# Patient Record
Sex: Male | Born: 1975 | Race: White | Hispanic: No | Smoking: Current every day smoker
Health system: Southern US, Community
[De-identification: ages and names within clinical notes are randomized; demographics above are authoritative.]

## PROBLEM LIST (undated history)

## (undated) DIAGNOSIS — B182 Chronic viral hepatitis C: Secondary | ICD-10-CM

## (undated) DIAGNOSIS — N289 Disorder of kidney and ureter, unspecified: Secondary | ICD-10-CM

## (undated) HISTORY — PX: HAND SURGERY: SHX662

---

## 1998-06-16 ENCOUNTER — Encounter: Payer: Self-pay | Admitting: Emergency Medicine

## 1998-06-16 ENCOUNTER — Emergency Department (HOSPITAL_COMMUNITY): Admission: EM | Admit: 1998-06-16 | Discharge: 1998-06-16 | Payer: Self-pay | Admitting: Emergency Medicine

## 1998-11-23 ENCOUNTER — Inpatient Hospital Stay (HOSPITAL_COMMUNITY): Admission: EM | Admit: 1998-11-23 | Discharge: 1998-11-27 | Payer: Self-pay | Admitting: Emergency Medicine

## 1999-07-24 ENCOUNTER — Emergency Department (HOSPITAL_COMMUNITY): Admission: EM | Admit: 1999-07-24 | Discharge: 1999-07-25 | Payer: Self-pay | Admitting: Emergency Medicine

## 1999-07-25 ENCOUNTER — Encounter: Payer: Self-pay | Admitting: Emergency Medicine

## 1999-08-03 ENCOUNTER — Emergency Department (HOSPITAL_COMMUNITY): Admission: EM | Admit: 1999-08-03 | Discharge: 1999-08-04 | Payer: Self-pay

## 1999-08-08 ENCOUNTER — Emergency Department (HOSPITAL_COMMUNITY): Admission: EM | Admit: 1999-08-08 | Discharge: 1999-08-08 | Payer: Self-pay | Admitting: Emergency Medicine

## 1999-09-10 ENCOUNTER — Inpatient Hospital Stay (HOSPITAL_COMMUNITY): Admission: EM | Admit: 1999-09-10 | Discharge: 1999-09-11 | Payer: Self-pay

## 1999-09-10 ENCOUNTER — Encounter: Payer: Self-pay | Admitting: *Deleted

## 1999-09-10 ENCOUNTER — Encounter: Payer: Self-pay | Admitting: Surgery

## 1999-09-11 ENCOUNTER — Encounter: Payer: Self-pay | Admitting: General Surgery

## 1999-09-11 ENCOUNTER — Encounter: Payer: Self-pay | Admitting: Surgery

## 1999-10-20 ENCOUNTER — Encounter: Payer: Self-pay | Admitting: Neurosurgery

## 1999-10-20 ENCOUNTER — Ambulatory Visit (HOSPITAL_COMMUNITY): Admission: RE | Admit: 1999-10-20 | Discharge: 1999-10-20 | Payer: Self-pay | Admitting: Neurosurgery

## 2001-04-13 ENCOUNTER — Encounter: Payer: Self-pay | Admitting: Emergency Medicine

## 2001-04-13 ENCOUNTER — Emergency Department (HOSPITAL_COMMUNITY): Admission: EM | Admit: 2001-04-13 | Discharge: 2001-04-13 | Payer: Self-pay | Admitting: Emergency Medicine

## 2002-02-27 ENCOUNTER — Emergency Department (HOSPITAL_COMMUNITY): Admission: EM | Admit: 2002-02-27 | Discharge: 2002-02-27 | Payer: Self-pay | Admitting: Emergency Medicine

## 2002-03-31 ENCOUNTER — Emergency Department (HOSPITAL_COMMUNITY): Admission: EM | Admit: 2002-03-31 | Discharge: 2002-03-31 | Payer: Self-pay | Admitting: Emergency Medicine

## 2002-04-09 ENCOUNTER — Emergency Department (HOSPITAL_COMMUNITY): Admission: EM | Admit: 2002-04-09 | Discharge: 2002-04-09 | Payer: Self-pay | Admitting: Emergency Medicine

## 2002-12-29 ENCOUNTER — Emergency Department (HOSPITAL_COMMUNITY): Admission: EM | Admit: 2002-12-29 | Discharge: 2002-12-29 | Payer: Self-pay | Admitting: Emergency Medicine

## 2003-10-25 ENCOUNTER — Emergency Department (HOSPITAL_COMMUNITY): Admission: EM | Admit: 2003-10-25 | Discharge: 2003-10-25 | Payer: Self-pay | Admitting: *Deleted

## 2004-04-08 ENCOUNTER — Emergency Department (HOSPITAL_COMMUNITY): Admission: EM | Admit: 2004-04-08 | Discharge: 2004-04-08 | Payer: Self-pay | Admitting: Emergency Medicine

## 2004-04-14 ENCOUNTER — Emergency Department (HOSPITAL_COMMUNITY): Admission: EM | Admit: 2004-04-14 | Discharge: 2004-04-15 | Payer: Self-pay | Admitting: *Deleted

## 2004-05-06 ENCOUNTER — Inpatient Hospital Stay: Payer: Self-pay | Admitting: Unknown Physician Specialty

## 2004-08-27 ENCOUNTER — Inpatient Hospital Stay: Payer: Self-pay | Admitting: Unknown Physician Specialty

## 2004-09-16 ENCOUNTER — Emergency Department (HOSPITAL_COMMUNITY): Admission: EM | Admit: 2004-09-16 | Discharge: 2004-09-16 | Payer: Self-pay | Admitting: Emergency Medicine

## 2004-11-02 ENCOUNTER — Emergency Department (HOSPITAL_COMMUNITY): Admission: EM | Admit: 2004-11-02 | Discharge: 2004-11-02 | Payer: Self-pay | Admitting: Emergency Medicine

## 2004-11-11 ENCOUNTER — Emergency Department (HOSPITAL_COMMUNITY): Admission: EM | Admit: 2004-11-11 | Discharge: 2004-11-12 | Payer: Self-pay | Admitting: Emergency Medicine

## 2005-01-31 ENCOUNTER — Emergency Department (HOSPITAL_COMMUNITY): Admission: EM | Admit: 2005-01-31 | Discharge: 2005-01-31 | Payer: Self-pay | Admitting: Emergency Medicine

## 2005-05-29 ENCOUNTER — Ambulatory Visit (HOSPITAL_COMMUNITY): Admission: RE | Admit: 2005-05-29 | Discharge: 2005-05-29 | Payer: Self-pay | Admitting: Family Medicine

## 2005-05-30 ENCOUNTER — Ambulatory Visit (HOSPITAL_COMMUNITY): Admission: RE | Admit: 2005-05-30 | Discharge: 2005-05-30 | Payer: Self-pay | Admitting: Family Medicine

## 2005-06-01 ENCOUNTER — Emergency Department (HOSPITAL_COMMUNITY): Admission: EM | Admit: 2005-06-01 | Discharge: 2005-06-01 | Payer: Self-pay | Admitting: Emergency Medicine

## 2005-06-10 ENCOUNTER — Ambulatory Visit (HOSPITAL_COMMUNITY): Admission: RE | Admit: 2005-06-10 | Discharge: 2005-06-10 | Payer: Self-pay | Admitting: Internal Medicine

## 2006-01-21 ENCOUNTER — Emergency Department (HOSPITAL_COMMUNITY): Admission: EM | Admit: 2006-01-21 | Discharge: 2006-01-21 | Payer: Self-pay | Admitting: Emergency Medicine

## 2006-02-06 ENCOUNTER — Emergency Department (HOSPITAL_COMMUNITY): Admission: EM | Admit: 2006-02-06 | Discharge: 2006-02-06 | Payer: Self-pay | Admitting: Emergency Medicine

## 2006-05-24 ENCOUNTER — Emergency Department (HOSPITAL_COMMUNITY): Admission: EM | Admit: 2006-05-24 | Discharge: 2006-05-24 | Payer: Self-pay | Admitting: Emergency Medicine

## 2006-06-02 ENCOUNTER — Emergency Department (HOSPITAL_COMMUNITY): Admission: EM | Admit: 2006-06-02 | Discharge: 2006-06-02 | Payer: Self-pay | Admitting: Emergency Medicine

## 2006-07-05 ENCOUNTER — Emergency Department (HOSPITAL_COMMUNITY): Admission: EM | Admit: 2006-07-05 | Discharge: 2006-07-05 | Payer: Self-pay | Admitting: Emergency Medicine

## 2006-07-19 ENCOUNTER — Inpatient Hospital Stay: Payer: Self-pay | Admitting: Unknown Physician Specialty

## 2006-09-24 ENCOUNTER — Emergency Department (HOSPITAL_COMMUNITY): Admission: EM | Admit: 2006-09-24 | Discharge: 2006-09-24 | Payer: Self-pay | Admitting: Emergency Medicine

## 2006-11-12 ENCOUNTER — Emergency Department (HOSPITAL_COMMUNITY): Admission: EM | Admit: 2006-11-12 | Discharge: 2006-11-12 | Payer: Self-pay | Admitting: Emergency Medicine

## 2006-12-02 ENCOUNTER — Emergency Department (HOSPITAL_COMMUNITY): Admission: EM | Admit: 2006-12-02 | Discharge: 2006-12-03 | Payer: Self-pay | Admitting: Emergency Medicine

## 2006-12-03 ENCOUNTER — Emergency Department (HOSPITAL_COMMUNITY): Admission: EM | Admit: 2006-12-03 | Discharge: 2006-12-03 | Payer: Self-pay | Admitting: Emergency Medicine

## 2006-12-08 ENCOUNTER — Emergency Department (HOSPITAL_COMMUNITY): Admission: EM | Admit: 2006-12-08 | Discharge: 2006-12-08 | Payer: Self-pay | Admitting: Emergency Medicine

## 2007-05-29 ENCOUNTER — Emergency Department: Payer: Self-pay | Admitting: Emergency Medicine

## 2007-07-10 ENCOUNTER — Emergency Department (HOSPITAL_COMMUNITY): Admission: EM | Admit: 2007-07-10 | Discharge: 2007-07-10 | Payer: Self-pay | Admitting: Emergency Medicine

## 2007-09-16 ENCOUNTER — Emergency Department (HOSPITAL_COMMUNITY): Admission: EM | Admit: 2007-09-16 | Discharge: 2007-09-16 | Payer: Self-pay | Admitting: Emergency Medicine

## 2007-11-14 ENCOUNTER — Emergency Department (HOSPITAL_COMMUNITY): Admission: EM | Admit: 2007-11-14 | Discharge: 2007-11-15 | Payer: Self-pay | Admitting: Emergency Medicine

## 2007-11-22 ENCOUNTER — Encounter: Payer: Self-pay | Admitting: Emergency Medicine

## 2007-11-23 ENCOUNTER — Inpatient Hospital Stay (HOSPITAL_COMMUNITY): Admission: AD | Admit: 2007-11-23 | Discharge: 2007-11-26 | Payer: Self-pay | Admitting: Psychiatry

## 2007-11-23 ENCOUNTER — Ambulatory Visit: Payer: Self-pay | Admitting: Psychiatry

## 2008-02-28 ENCOUNTER — Emergency Department (HOSPITAL_COMMUNITY): Admission: EM | Admit: 2008-02-28 | Discharge: 2008-02-28 | Payer: Self-pay | Admitting: Emergency Medicine

## 2008-06-18 ENCOUNTER — Emergency Department (HOSPITAL_COMMUNITY): Admission: EM | Admit: 2008-06-18 | Discharge: 2008-06-18 | Payer: Self-pay | Admitting: Emergency Medicine

## 2009-01-24 ENCOUNTER — Emergency Department (HOSPITAL_COMMUNITY): Admission: EM | Admit: 2009-01-24 | Discharge: 2009-01-25 | Payer: Self-pay | Admitting: Emergency Medicine

## 2010-05-08 ENCOUNTER — Emergency Department (HOSPITAL_COMMUNITY): Admission: EM | Admit: 2010-05-08 | Discharge: 2010-05-08 | Payer: Self-pay | Admitting: Emergency Medicine

## 2010-08-27 LAB — CULTURE, ROUTINE-ABSCESS

## 2010-10-29 NOTE — H&P (Signed)
[name] [name], [name] [name]                ACCOUNT NO.:  [id number]   MEDICAL RECORD NO.:  [id number]          PATIENT TYPE:  IPS   LOCATION:  0306                          FACILITY:  [facility]   PHYSICIAN:  Jasmine Pang, M.D. DATE OF BIRTH:  Jun 18, 1975   DATE OF ADMISSION:  11/23/2007  DATE OF DISCHARGE:                       PSYCHIATRIC ADMISSION ASSESSMENT   HISTORY OF PRESENT ILLNESS:  The patient presents with a history of  substance use.  He has been using drugs for approximately 14 years,  primarily using OxyContin and oxycodone, using up to anywhere from 150-  400 mg of opiates daily.  He states he has been unable to find pills and  needs to be detoxed.  He has been using heroin as well, using IV.  He  was in the methadone program prior, which was helpful, but states  financially he was unable to participate any more.  Denies any  depression, suicidal thoughts, or hallucinations.   PAST PSYCHIATRIC HISTORY:  First admission to [facility].  Again, was at the [facility] before for methadone program, but was  unable to afford medications and had no transportation.   SOCIAL HISTORY:  He is a 35 year old male who lives in [place].  He  has been doing odd jobs.  He lives with his mother.   FAMILY HISTORY:  None.   ALCOHOL AND DRUG HISTORY:  As above.  No alcohol use.   PRIMARY CARE Yesmin Mutch:  Has none currently.   MEDICAL PROBLEMS:  Denies any known medical issues.   MEDICATIONS:  None prior to arrival.   DRUG ALLERGIES:  No known allergies.   PHYSICAL EXAMINATION:  GENERAL:  The patient was fully assessed at [facility] Emergency Department.  He received Ativan.  He presents today in no  acute physical distress.  He is feeling anxious and requesting Xanax.  VITAL SIGNS:  Temperature is 98.6, 78 heart rate, 18 respirations, blood  pressure is 117/73.  He is 147 pounds, 6 feet 1 inch tall.   Urine drug screen is positive for opiates, positive for cocaine.  Urinalysis is negative.  Chest x-ray was negative.   MENTAL STATUS EXAM:  He is alert, cooperative, poor eye contact, dressed  appropriately.  Speech is soft-spoken.  The patient's affect is flat.  Thought processes are coherent.  No evidence of psychosis.  Cognitive  function is intact.  Memory is good.  Judgment and insight appear to be  good.  He is requesting help.   DIAGNOSES:   AXIS I:  Opiate dependence.  Cocaine abuse.   AXIS II:  Deferred.   AXIS III:  No known medical conditions.   AXIS IV:  Other psychosocial problems related to chronic substance use,  and possible problems related to occupation and economic issues.   AXIS V:  Current is 40.   PLAN:  Contract for safety.  Stabilize mood and thinking.  We will  initiate the clonidine protocol, although the patient is endorsing that  he is unable to tolerate the clonidine.  We will continue to offer  medications and offer the p.r.n.'s available for stomach  cramps and  pain.  We will also have Librium available for anxiety.  We will also  add Seroquel, as the patient is expressing some sensations of feeling  anxious.  Case manager will assess follow-up rehab with him.  His  tentative length of stay is 3-5 days.      Landry Corporal, N.P.      Jasmine Pang, M.D.  Electronically Signed    JO/MEDQ  D:  11/24/2007  T:  11/24/2007  Job:  [id number]

## 2010-10-29 NOTE — Discharge Summary (Signed)
[name], [name]                ACCOUNT NO.:  [id number]   MEDICAL RECORD NO.:  [id number]          PATIENT TYPE:  IPS   LOCATION:  0306                          FACILITY:  [facility]   PHYSICIAN:  Jasmine Pang, M.D. DATE OF BIRTH:  [birth year]   DATE OF ADMISSION:  11/23/2007  DATE OF DISCHARGE:  11/26/2007                               DISCHARGE SUMMARY   IDENTIFICATION:  This is a 35 year old single white male who lives in  [place].  He was admitted on a voluntary basis on November 23, 2007.   HISTORY OF PRESENT ILLNESS:  The patient presents with a history of  substance use.  He has been using drugs for approximately 14 years,  primarily OxyContin and oxycodone.  He states he uses up to anywhere  from 150-400 mg of opiates daily.  He states he had been unable to find  pills and needed to be detox.  He has been using heroin as well IV.  He  was in methadone program prior, which was helpful, but states  financially, he was unable to participate anymore.  He denies any  depression, suicidal thoughts, or hallucinations.   PAST PSYCHIATRIC HISTORY:  This is the first admission to [facility], again he was at [facility] for the methadone  program, but was unable to afford the medications and had no  transportation.   FAMILY HISTORY:  None.   ALCOHOL AND DRUG HISTORY:  As above.  No alcohol abuse.   PRIMARY CARE:  None currently.   MEDICAL PROBLEMS:  No known medical issues.   MEDICATIONS:  None prior to arrival.   ALLERGIES:  No known drug allergies.   PHYSICAL FINDINGS:  There were no acute physical or medical problems  noted.  He was fully assessed at the [facility] Emergency Department.   ADMISSION LABORATORIES:  Urine drug screen was positive for opiates and  positive for cocaine.  Urinalysis was negative.  Chest x-ray was  negative.   HOSPITAL COURSE:  Upon admission, the patient was started on trazodone  100 mg p.o. q.h.s. and clonidine detox  protocol.  He was started on  Seroquel 50 mg p.o. q.6 hours p.r.n. anxiety.  The patient tolerated  these medications well with no significant side effects.  He did not  stay on the clonidine.  He did not feel he needed this and was not  taking it.  In individual sessions with me, the patient was reserved and  somewhat uncooperative.  He stated he was on methadone, but was unable  to afford this.  He used cocaine and heroin.  He does not like the  clonidine, that makes him feel bad.  He states he wants long-term rehab.  As hospitalization progressed, mental status improved.  The patient was  somewhat irritable.  On November 25, 2007, he wanted to go home if he could  not get methadone or Suboxone.  He then decided he wanted to go to the  AD[facility] program at [facility] and requested discharge and he wants to do this.  On November 26, 2007, his sleep was  good.  Mood was less depressed, less  anxious.  Affect, consistent with mood.  There was no suicidal or  homicidal ideation.  No thoughts of self-injurious behavior.  No  auditory or visual hallucinations.  No paranoia or delusions.  Thoughts  were logical and goal-directed.  Thought content, no predominant theme.  Cognitive was grossly intact.  The patient wanted to leave today to go  to ADAT program at [place].   DISCHARGE DIAGNOSES:  Axis I:  Opiate dependence and cocaine abuse.  Axis II:  Features of personality disorder, not otherwise specified.  Axis III:  None.  Axis IV: Other psychosocial problems related to chronic substance use  and possible problems related to occupation and economic issues -  moderate.  Axis V:  Global assessment of functioning was 50 upon discharge and 40  upon admission.  GAF highest past year was 65.   DISCHARGE PLANS:  There was no specific activity level or dietary  restrictions.   POSTHOSPITAL CARE PLANS:  The patient is going to go to the [facility]  program on November 26, 2007.  He is going to walk-in.  He states they  have  beds available for a walk-in patient.   DISCHARGE MEDICATIONS:  None.      Jasmine Pang, M.D.  Electronically Signed     BHS/MEDQ  D:  11/26/2007  T:  11/26/2007  Job:  [id number]
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
## 2011-02-12 ENCOUNTER — Emergency Department (HOSPITAL_COMMUNITY)
Admission: EM | Admit: 2011-02-12 | Discharge: 2011-02-12 | Disposition: A | Discharge status: Home / Self Care | Payer: Self-pay | Attending: Emergency Medicine | Admitting: Emergency Medicine

## 2011-02-12 DIAGNOSIS — F17200 Nicotine dependence, unspecified, uncomplicated: Secondary | ICD-10-CM | POA: Insufficient documentation

## 2011-02-12 DIAGNOSIS — S335XXA Sprain of ligaments of lumbar spine, initial encounter: Secondary | ICD-10-CM | POA: Insufficient documentation

## 2011-02-12 DIAGNOSIS — X500XXA Overexertion from strenuous movement or load, initial encounter: Secondary | ICD-10-CM | POA: Insufficient documentation

## 2011-02-12 DIAGNOSIS — Y9269 Other specified industrial and construction area as the place of occurrence of the external cause: Secondary | ICD-10-CM | POA: Insufficient documentation

## 2011-02-12 DIAGNOSIS — S39012A Strain of muscle, fascia and tendon of lower back, initial encounter: Secondary | ICD-10-CM

## 2011-02-12 MED ORDER — IBUPROFEN 800 MG PO TABS: 800.0000 mg | ORAL_TABLET | Freq: Three times a day (TID) | ORAL | Status: AC

## 2011-02-12 MED ORDER — KETOROLAC TROMETHAMINE 60 MG/2ML IM SOLN
60.0000 mg | Freq: Once | INTRAMUSCULAR | Status: AC
  Administered 2011-02-12: 60 mg via INTRAMUSCULAR
  Filled 2011-02-12: qty 2

## 2011-02-12 MED ORDER — CYCLOBENZAPRINE HCL 10 MG PO TABS: 10.0000 mg | ORAL_TABLET | Freq: Two times a day (BID) | ORAL | Status: AC | PRN

## 2011-02-12 NOTE — ED Notes (Signed)
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  Today picked up piece of plywood and had severe pain in lower back.  Pain doesn't radiate down legs.

## 2011-02-12 NOTE — ED Provider Notes (Signed)
History   Scribed for Vida Roller, MD, the patient was seen in room APA09/APA09. This chart was scribed by Clarita Crane. This patient's care was started at 10:24AM.   CSN: [id number] Arrival date & time: 02/12/2011 10:03 AM  Chief Complaint  Patient presents with  . Back Pain   HPI [name] [name] is a 35 y.o. male who presents to the Emergency Department complaining of constant non-radiating lower back pain onset yesterday but worse this morning afterpersistent since. 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 Reports back pain is aggravated with flexion of torso at waist. Denies numbness, weakness, dysuria, urinary retention, incontinence.   History reviewed. No pertinent past medical history.  Past Surgical History  Procedure Date  . Hand surgery     No family history on file.  History  Substance Use Topics  . Smoking status: Current Everyday Smoker  . Smokeless tobacco: Not on file  . Alcohol Use: No     Review of Systems  Constitutional: Negative for fever.  HENT: Negative for neck pain.   Respiratory: Negative for shortness of breath.   Cardiovascular: Negative for chest pain.  Gastrointestinal: Negative for nausea.  Genitourinary: Negative for dysuria and hematuria.  Musculoskeletal: Positive for back pain.  Neurological: Negative for weakness and numbness.    Physical Exam  BP 109/82  Pulse 73  Temp(Src) 97.3 F (36.3 C) (Oral)  Resp 18  Ht 5\' 11"  (1.803 m)  Wt 165 lb (74.844 kg)  BMI 23.01 kg/m2  SpO2 100%  Physical Exam  Nursing note and vitals reviewed. Constitutional: He is oriented to person, place, and time. He appears well-developed and well-nourished. No distress.       Uncomfortable appearing.   HENT:  Head: Normocephalic and atraumatic.  Eyes: EOM are normal.  Neck: Neck supple.  Cardiovascular: Normal rate, regular rhythm and normal heart sounds.  Exam reveals no gallop and no  friction rub.   No murmur heard. Pulmonary/Chest: Effort normal and breath sounds normal. He has no wheezes.  Musculoskeletal: Normal range of motion. He exhibits no edema.       No midline spinal tenderness. Mild right lower paraspinal tenderness.   Neurological: He is alert and oriented to person, place, and time. He has normal strength and normal reflexes. No sensory deficit.       Gait normal.   Skin: Skin is warm and dry.  Psychiatric: He has a normal mood and affect. His behavior is normal.    ED Course  Procedures  MDM Overall he is well appearing with focal low back tenderness after trying to lift a heavy object. Has normal neurologic exam, gait, reflexes, sensation, strength. No pathologic findings found. Intramuscular Toradol given. Discharge home with followup as needed.  I personally performed the services described in this documentation, which was scribed in my presence. The recorded information has been reviewed and considered. Vida Roller, MD       Vida Roller, MD 02/12/11 [phone]

## 2011-02-13 ENCOUNTER — Emergency Department (HOSPITAL_COMMUNITY)
Admission: EM | Admit: 2011-02-13 | Discharge: 2011-02-14 | Disposition: A | Discharge status: Home / Self Care | Payer: Self-pay | Attending: Emergency Medicine | Admitting: Emergency Medicine

## 2011-02-13 ENCOUNTER — Encounter (HOSPITAL_COMMUNITY): Payer: Self-pay | Admitting: Emergency Medicine

## 2011-02-13 DIAGNOSIS — Y9269 Other specified industrial and construction area as the place of occurrence of the external cause: Secondary | ICD-10-CM | POA: Insufficient documentation

## 2011-02-13 DIAGNOSIS — X500XXA Overexertion from strenuous movement or load, initial encounter: Secondary | ICD-10-CM | POA: Insufficient documentation

## 2011-02-13 DIAGNOSIS — M545 Low back pain, unspecified: Secondary | ICD-10-CM | POA: Insufficient documentation

## 2011-02-13 DIAGNOSIS — M5416 Radiculopathy, lumbar region: Secondary | ICD-10-CM

## 2011-02-13 DIAGNOSIS — F17200 Nicotine dependence, unspecified, uncomplicated: Secondary | ICD-10-CM | POA: Insufficient documentation

## 2011-02-13 DIAGNOSIS — IMO0002 Reserved for concepts with insufficient information to code with codable children: Secondary | ICD-10-CM | POA: Insufficient documentation

## 2011-02-13 NOTE — ED Notes (Signed)
C/o low back pain with incontinence of stool

## 2011-02-13 NOTE — ED Notes (Addendum)
Pt seen in ER earlier today, reports increase in back pain, w/ numbness & weakness to the left leg. States medications not working & does not feel like a muscle to him thinks he has messed up something in his back

## 2011-02-14 MED ORDER — HYDROCODONE-ACETAMINOPHEN 5-325 MG PO TABS: ORAL_TABLET | ORAL | Status: DC

## 2011-02-14 MED ORDER — PREDNISONE 10 MG PO TABS: 50.0000 mg | ORAL_TABLET | Freq: Every day | ORAL | Status: AC

## 2011-02-14 MED ORDER — PREDNISONE 20 MG PO TABS
60.0000 mg | ORAL_TABLET | Freq: Once | ORAL | Status: AC
  Administered 2011-02-14: 60 mg via ORAL
  Filled 2011-02-14: qty 3

## 2011-02-14 MED ORDER — HYDROCODONE-ACETAMINOPHEN 5-325 MG PO TABS
1.0000 | ORAL_TABLET | Freq: Once | ORAL | Status: AC
  Administered 2011-02-14: 1 via ORAL
  Filled 2011-02-14: qty 1

## 2011-02-14 NOTE — ED Provider Notes (Signed)
History     CSN: [id number] Arrival date & time: 02/13/2011  9:57 PM  Chief Complaint  Patient presents with  . Back Pain   HPI Comments: Seen here yest for same problem.  Injured back 2 days ago lifting sheet of plywood.  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  Back pain is worse and radiates to L foot.  Patient is a 35 y.o. male presenting with back pain. The history is provided by the patient. No language interpreter was used.  Back Pain  This is a new problem. The current episode started 2 days ago. The problem occurs constantly. The problem has been gradually worsening. The pain is associated with lifting heavy objects. The pain is present in the lumbar spine. The quality of the pain is described as stabbing. The pain radiates to the left foot. The pain is at a severity of 7/10.    History reviewed. No pertinent past medical history.  Past Surgical History  Procedure Date  . Hand surgery     No family history on file.  History  Substance Use Topics  . Smoking status: Current Everyday Smoker  . Smokeless tobacco: Not on file  . Alcohol Use: No      Review of Systems  Musculoskeletal: Positive for back pain.  All other systems reviewed and are negative.    Physical Exam  BP 106/63  Pulse 70  Temp 97.8 F (36.6 C)  Resp 16  Ht 5\' 11"  (1.803 m)  Wt 160 lb (72.576 kg)  BMI 22.32 kg/m2  SpO2 100%  Physical Exam  Nursing note and vitals reviewed. Constitutional: He is oriented to person, place, and time. Vital signs are normal. He appears well-developed and well-nourished. No distress.  HENT:  Head: Normocephalic and atraumatic.  Right Ear: External ear normal.  Left Ear: External ear normal.  Nose: Nose normal.  Mouth/Throat: No oropharyngeal exudate.  Eyes: Conjunctivae and EOM are normal. Pupils are equal, round, and reactive to light. Right eye exhibits no discharge. Left eye exhibits no discharge. No scleral icterus.   Neck: Normal range of motion. Neck supple. No JVD present. No tracheal deviation present. No thyromegaly present.  Cardiovascular: Normal rate, regular rhythm, normal heart sounds, intact distal pulses and normal pulses.  Exam reveals no gallop and no friction rub.   No murmur heard. Pulmonary/Chest: Effort normal and breath sounds normal. No stridor. No respiratory distress. He has no wheezes. He has no rales. He exhibits no tenderness.  Abdominal: Soft. Normal appearance and bowel sounds are normal. He exhibits no distension and no mass. There is no tenderness. There is no rebound and no guarding.  Musculoskeletal: Normal range of motion. He exhibits no edema and no tenderness.       Arms: Lymphadenopathy:    He has no cervical adenopathy.  Neurological: He is alert and oriented to person, place, and time. He has normal reflexes. He displays normal reflexes. No cranial nerve deficit or sensory deficit. He exhibits normal muscle tone. Coordination normal. GCS eye subscore is 4. GCS verbal subscore is 5. GCS motor subscore is 6.  Reflex Scores:      Patellar reflexes are 2+ on the right side and 2+ on the left side.      Achilles reflexes are 2+ on the right side and 2+ on the left side. Skin: Skin is warm and dry. No rash noted. He is not diaphoretic.  Psychiatric: He has a normal mood  and affect. His speech is normal and behavior is normal. Judgment and thought content normal. Cognition and memory are normal.    ED Course  Procedures  MDM       Worthy Rancher, PA 02/14/11 [phone]

## 2011-03-03 NOTE — ED Provider Notes (Signed)
Medical screening examination/treatment/procedure(s) were performed by non-physician practitioner and as supervising physician I was immediately available for consultation/collaboration.  Nicoletta Dress. Colon Branch, MD 03/03/11 [phone]

## 2011-03-07 ENCOUNTER — Emergency Department (HOSPITAL_COMMUNITY)
Admission: EM | Admit: 2011-03-07 | Discharge: 2011-03-07 | Discharge status: Against Medical Advice | Payer: Self-pay | Attending: Emergency Medicine | Admitting: Emergency Medicine

## 2011-03-07 ENCOUNTER — Encounter (HOSPITAL_COMMUNITY): Payer: Self-pay | Admitting: *Deleted

## 2011-03-07 DIAGNOSIS — Z5320 Procedure and treatment not carried out because of patient's decision for unspecified reasons: Secondary | ICD-10-CM | POA: Insufficient documentation

## 2011-03-07 DIAGNOSIS — R109 Unspecified abdominal pain: Secondary | ICD-10-CM | POA: Insufficient documentation

## 2011-03-07 NOTE — ED Notes (Signed)
Pt c/o mid abd pain that started today; pt denies any vomiting but has been nauseous

## 2011-03-08 ENCOUNTER — Encounter (HOSPITAL_COMMUNITY): Payer: Self-pay | Admitting: *Deleted

## 2011-03-08 ENCOUNTER — Emergency Department (HOSPITAL_COMMUNITY): Payer: Medicaid Other

## 2011-03-08 ENCOUNTER — Emergency Department (HOSPITAL_COMMUNITY)
Admission: EM | Admit: 2011-03-08 | Discharge: 2011-03-08 | Disposition: A | Discharge status: Home / Self Care | Payer: Medicaid Other | Attending: Emergency Medicine | Admitting: Emergency Medicine

## 2011-03-08 DIAGNOSIS — R1010 Upper abdominal pain, unspecified: Secondary | ICD-10-CM

## 2011-03-08 DIAGNOSIS — F17200 Nicotine dependence, unspecified, uncomplicated: Secondary | ICD-10-CM | POA: Insufficient documentation

## 2011-03-08 DIAGNOSIS — R109 Unspecified abdominal pain: Secondary | ICD-10-CM | POA: Insufficient documentation

## 2011-03-08 LAB — URINALYSIS, ROUTINE W REFLEX MICROSCOPIC
Bilirubin Urine: NEGATIVE
Glucose, UA: NEGATIVE mg/dL
Hgb urine dipstick: NEGATIVE
Ketones, ur: NEGATIVE mg/dL
Leukocytes, UA: NEGATIVE
Nitrite: NEGATIVE
Protein, ur: NEGATIVE mg/dL
Specific Gravity, Urine: 1.02 (ref 1.005–1.030)
Urobilinogen, UA: 0.2 mg/dL (ref 0.0–1.0)
pH: 6.5 (ref 5.0–8.0)

## 2011-03-08 LAB — COMPREHENSIVE METABOLIC PANEL
ALT: 14 U/L (ref 0–53)
AST: 22 U/L (ref 0–37)
Albumin: 3.6 g/dL (ref 3.5–5.2)
Alkaline Phosphatase: 86 U/L (ref 39–117)
BUN: 12 mg/dL (ref 6–23)
CO2: 28 mEq/L (ref 19–32)
Calcium: 9.6 mg/dL (ref 8.4–10.5)
Chloride: 101 mEq/L (ref 96–112)
Creatinine, Ser: 0.96 mg/dL (ref 0.50–1.35)
GFR calc Af Amer: >60 mL/min (ref >60)
GFR calc non Af Amer: >60 mL/min (ref >60)
Glucose, Bld: 105 mg/dL — ABNORMAL HIGH (ref 70–99)
Potassium: 4 mEq/L (ref 3.5–5.1)
Sodium: 137 mEq/L (ref 135–145)
Total Bilirubin: 0.4 mg/dL (ref 0.3–1.2)
Total Protein: 7.8 g/dL (ref 6.0–8.3)

## 2011-03-08 LAB — DIFFERENTIAL
Basophils Absolute: 0 10*3/uL (ref 0.0–0.1)
Basophils Relative: 0 % (ref 0–1)
Eosinophils Absolute: 0.2 10*3/uL (ref 0.0–0.7)
Eosinophils Relative: 3 % (ref 0–5)
Lymphocytes Relative: 25 % (ref 12–46)
Lymphs Abs: 1.7 10*3/uL (ref 0.7–4.0)
Monocytes Absolute: 0.5 10*3/uL (ref 0.1–1.0)
Monocytes Relative: 7 % (ref 3–12)
Neutro Abs: 4.6 10*3/uL (ref 1.7–7.7)
Neutrophils Relative %: 65 % (ref 43–77)

## 2011-03-08 LAB — CBC
HCT: 43 % (ref 39.0–52.0)
Hemoglobin: 14.9 g/dL (ref 13.0–17.0)
MCH: 31.6 pg (ref 26.0–34.0)
MCHC: 34.7 g/dL (ref 30.0–36.0)
MCV: 91.3 fL (ref 78.0–100.0)
Platelets: 232 10*3/uL (ref 150–400)
RBC: 4.71 MIL/uL (ref 4.22–5.81)
RDW: 13 % (ref 11.5–15.5)
WBC: 7 10*3/uL (ref 4.0–10.5)

## 2011-03-08 LAB — LIPASE, BLOOD: Lipase: 25 U/L (ref 11–59)

## 2011-03-08 MED ORDER — GI COCKTAIL ~~LOC~~
30.0000 mL | Freq: Once | ORAL | Status: AC
  Administered 2011-03-08: 30 mL via ORAL
  Filled 2011-03-08: qty 30

## 2011-03-08 MED ORDER — SODIUM CHLORIDE 0.9 % IV SOLN
Freq: Once | INTRAVENOUS | Status: AC
  Administered 2011-03-08: 13:00:00 via INTRAVENOUS

## 2011-03-08 MED ORDER — HYDROMORPHONE HCL 1 MG/ML IJ SOLN
1.0000 mg | Freq: Once | INTRAMUSCULAR | Status: AC
  Administered 2011-03-08: 1 mg via INTRAVENOUS
  Filled 2011-03-08: qty 1

## 2011-03-08 MED ORDER — HYDROCODONE-ACETAMINOPHEN 5-325 MG PO TABS: ORAL_TABLET | ORAL | Status: AC

## 2011-03-08 MED ORDER — FAMOTIDINE IN NACL 20-0.9 MG/50ML-% IV SOLN
20.0000 mg | Freq: Once | INTRAVENOUS | Status: AC
  Administered 2011-03-08: 20 mg via INTRAVENOUS
  Filled 2011-03-08: qty 50

## 2011-03-08 NOTE — ED Notes (Signed)
Pt states that the pain medication has helped "somewhat" pt and family updated on plan of care

## 2011-03-08 NOTE — ED Notes (Signed)
Dr. Clarene Duke notified of pt's pain being unchanged, no additional orders given

## 2011-03-08 NOTE — ED Provider Notes (Signed)
History  Scribed for Dr. Clarene Duke, the patient was seen in room 17. The chart was scribed by Gilman Schmidt. The patients care was started at 1233.  CSN: [id number] Arrival date & time: 03/08/2011 11:15 AM  Chief Complaint  Patient presents with  . Abdominal Pain    HPI   HPI Pt was seen at 1233.  [name] [name] is a 35 y.o. male who presents to the Emergency Department complaining of gradual onset and persistence of constant upper abdominal "pain" since last night. Pt describes the pain as a "dull ache," and that it "feels like someone punched me in the stomach."  Pt came to the ED last night but left AMA because he didn't want to wait.  Has not taken any OTC's for same.  Has previous hx of same pain but cannot recall what the dx was at that time.  Denies sick contacts.  Denies CP/SOB, no N/V/D, no back pain, no fevers, no rash.   PAST MEDICAL HISTORY:  History reviewed. No pertinent past medical history.   PAST SURGICAL HISTORY:  Past Surgical History  Procedure Date  . Hand surgery      MEDICATIONS:  Previous Medications   HYDROCODONE-ACETAMINOPHEN (NORCO) 5-325 MG PER TABLET    One po QID prn pain     ALLERGIES:  Allergies as of 03/08/2011  . (No Known Allergies)     SOCIAL HISTORY: History  Substance Use Topics  . Smoking status: Current Everyday Smoker  . Smokeless tobacco: Not on file  . Alcohol Use: No      Review of Systems  Review of Systems ROS: Statement: All systems negative except as marked or noted in the HPI; Constitutional: Negative for fever and chills. ; ; Eyes: Negative for eye pain, redness and discharge. ; ; ENMT: Negative for ear pain, hoarseness, nasal congestion, sinus pressure and sore throat. ; ; Cardiovascular: Negative for chest pain, palpitations, diaphoresis, dyspnea and peripheral edema. ; ; Respiratory: Negative for cough, wheezing and stridor. ; ; Gastrointestinal: +upper abd pain. Negative for nausea, vomiting, diarrhea, blood in stool,  hematemesis, jaundice and rectal bleeding. . ; ; Genitourinary: Negative for dysuria, flank pain and hematuria. ; ; Musculoskeletal: Negative for back pain and neck pain. Negative for swelling and trauma.; ; Skin: Negative for pruritus, rash, abrasions, blisters, bruising and skin lesion.; ; Neuro: Negative for headache, lightheadedness and neck stiffness. Negative for weakness, altered level of consciousness , altered mental status, extremity weakness, paresthesias, involuntary movement, seizure and syncope.     Physical Exam    BP 116/73  Pulse 59  Temp(Src) 98 F (36.7 C) (Oral)  Resp 20  Ht 5\' 11"  (1.803 m)  Wt 160 lb (72.576 kg)  BMI 22.32 kg/m2  SpO2 99%  Physical Exam 1233: Physical examination:  Nursing notes reviewed; Vital signs and O2 SAT reviewed;  Constitutional: Well developed, Well nourished, Well hydrated, In no acute distress; Head:  Normocephalic, atraumatic; Eyes: EOMI, PERRL, No scleral icterus; ENMT: Mouth and pharynx normal, Mucous membranes moist; Neck: Supple, Full range of motion, No lymphadenopathy; Cardiovascular: Regular rate and rhythm, No murmur, rub, or gallop; Respiratory: Breath sounds clear & equal bilaterally, No rales, rhonchi, wheezes, or rub, Normal respiratory effort/excursion; Chest: Nontender, Movement normal; Abdomen: Soft, +tenderness mid-epigastric area to palp.  No rebound or guarding., Nondistended, Normal bowel sounds;  Extremities: Pulses normal, No tenderness, No edema, No calf edema or asymmetry.; Neuro: AA&Ox3, Major CN grossly intact.  Speech clear. No gross focal motor or sensory  deficits in extremities.; Skin: Color normal, Warm, Dry, no rash.    ED Course  Procedures  MDM Reviewed: nursing note and vitals Interpretation: labs and x-ray      Labs:  Results for orders placed during the hospital encounter of 03/08/11  URINALYSIS, ROUTINE W REFLEX MICROSCOPIC      Component Value Range   Color, Urine YELLOW  YELLOW    Appearance  CLEAR  CLEAR    Specific Gravity, Urine 1.020  1.005 - 1.030    pH 6.5  5.0 - 8.0    Glucose, UA NEGATIVE  NEGATIVE (mg/dL)   Hgb urine dipstick NEGATIVE  NEGATIVE    Bilirubin Urine NEGATIVE  NEGATIVE    Ketones, ur NEGATIVE  NEGATIVE (mg/dL)   Protein, ur NEGATIVE  NEGATIVE (mg/dL)   Urobilinogen, UA 0.2  0.0 - 1.0 (mg/dL)   Nitrite NEGATIVE  NEGATIVE    Leukocytes, UA NEGATIVE  NEGATIVE   CBC      Component Value Range   WBC 7.0  4.0 - 10.5 (K/uL)   RBC 4.71  4.22 - 5.81 (MIL/uL)   Hemoglobin 14.9  13.0 - 17.0 (g/dL)   HCT 16.1  09.6 - 04.5 (%)   MCV 91.3  78.0 - 100.0 (fL)   MCH 31.6  26.0 - 34.0 (pg)   MCHC 34.7  30.0 - 36.0 (g/dL)   RDW 40.9  81.1 - 91.4 (%)   Platelets 232  150 - 400 (K/uL)  DIFFERENTIAL      Component Value Range   Neutrophils Relative 65  43 - 77 (%)   Neutro Abs 4.6  1.7 - 7.7 (K/uL)   Lymphocytes Relative 25  12 - 46 (%)   Lymphs Abs 1.7  0.7 - 4.0 (K/uL)   Monocytes Relative 7  3 - 12 (%)   Monocytes Absolute 0.5  0.1 - 1.0 (K/uL)   Eosinophils Relative 3  0 - 5 (%)   Eosinophils Absolute 0.2  0.0 - 0.7 (K/uL)   Basophils Relative 0  0 - 1 (%)   Basophils Absolute 0.0  0.0 - 0.1 (K/uL)  COMPREHENSIVE METABOLIC PANEL      Component Value Range   Sodium 137  135 - 145 (mEq/L)   Potassium 4.0  3.5 - 5.1 (mEq/L)   Chloride 101  96 - 112 (mEq/L)   CO2 28  19 - 32 (mEq/L)   Glucose, Bld 105 (*) 70 - 99 (mg/dL)   BUN 12  6 - 23 (mg/dL)   Creatinine, Ser 7.82  0.50 - 1.35 (mg/dL)   Calcium 9.6  8.4 - 95.6 (mg/dL)   Total Protein 7.8  6.0 - 8.3 (g/dL)   Albumin 3.6  3.5 - 5.2 (g/dL)   AST 22  0 - 37 (U/L)   ALT 14  0 - 53 (U/L)   Alkaline Phosphatase 86  39 - 117 (U/L)   Total Bilirubin 0.4  0.3 - 1.2 (mg/dL)   GFR calc non Af Amer >60  >60 (mL/min)   GFR calc Af Amer >60  >60 (mL/min)  LIPASE, BLOOD      Component Value Range   Lipase 25  11 - 59 (U/L)    RADIOLOGY:  DG Abd Acute W/Chest RADIOLOGY REPORT* Clinical Data: Epigastric  abdominal pain. ACUTE ABDOMEN SERIES (ABDOMEN 2 VIEW & CHEST 1 VIEW) 03/08/2011: Comparison: CT abdomen and pelvis 12/03/2006 and 11/12/2006 [facility]. Findings: Bowel gas pattern unremarkable without evidence of obstruction or significant ileus. No evidence of  free air or significant air fluid levels on the erect image. Large amount of stool throughout the colon which is upper normal in caliber.  Phlebolith in the left side of the low pelvis. No visible opaque urinary tract calculi. Regional skeleton intact.Cardiomediastinal silhouette unremarkable. Lungs clear. No pleural effusions. IMPRESSION: 1. No acute abdominal abnormality apart from possible Constipation 2. No acute cardiopulmonary disease. Original Report Authenticated By: Arnell Sieving, M.D.  1525:  Pain improved after meds.  Wants to leave now.  Dx testing d/w pt.  Questions answered.  Verb understanding, agreeable to d/c home with outpt f/u.   Medications given in ED:   0.9 %  sodium chloride infusion (0  Intravenous Stopped 03/08/11 1537)  famotidine (PEPCID) IVPB 20 mg (0 mg Intravenous Stopped 03/08/11 1446)  gi cocktail (30 mL Oral Given 03/08/11 1301)  HYDROmorphone (DILAUDID) injection 1 mg (1 mg Intravenous Given 03/08/11 1444)   Amariah Kierstead M   I personally performed the services described in this documentation, which was scribed in my presence. The recorded information has been reviewed and considered. Imraan Wendell Allison Quarry, DO 03/08/11 1955

## 2011-03-08 NOTE — ED Notes (Signed)
Mid center abd pain that started last pm, pt states that he did come to er last pm and left before being seen due to wait time,  Denies any n/v, uti symptoms, or problems with bowel movements

## 2011-03-08 NOTE — ED Notes (Signed)
abd pain that started last night, states that it feels like someone punched him in the gut, denies any n/v, denies any uti symptoms or bowel problems,

## 2011-03-08 NOTE — ED Notes (Signed)
Pt states that his pain has unchanged, pt ambulatory to restroom, requesting to go outside to smoke, advised pt of nonsmoking policy of hospital

## 2011-03-09 LAB — URINE CULTURE
Colony Count: NO GROWTH
Culture  Setup Time: 201209222030
Culture: NO GROWTH

## 2011-03-12 LAB — URINE CULTURE: Colony Count: >=100000

## 2011-03-12 LAB — URINALYSIS, ROUTINE W REFLEX MICROSCOPIC
Bilirubin Urine: NEGATIVE
Glucose, UA: NEGATIVE
Ketones, ur: NEGATIVE
Nitrite: NEGATIVE
Protein, ur: NEGATIVE
Specific Gravity, Urine: 1.025
Urobilinogen, UA: 0.2
pH: 6

## 2011-03-12 LAB — URINE MICROSCOPIC-ADD ON

## 2011-03-13 LAB — DIFFERENTIAL
Basophils Absolute: 0.1
Basophils Relative: 1
Eosinophils Absolute: 0.2
Eosinophils Relative: 2
Lymphocytes Relative: 26
Lymphs Abs: 2.1
Monocytes Absolute: 0.8
Monocytes Relative: 10
Neutro Abs: 5.1
Neutrophils Relative %: 61

## 2011-03-13 LAB — CBC
HCT: 42.7
Hemoglobin: 15
MCHC: 35.1
MCV: 91.8
Platelets: 270
RBC: 4.65
RDW: 12.3
WBC: 8.3

## 2011-03-13 LAB — URINALYSIS, ROUTINE W REFLEX MICROSCOPIC
Bilirubin Urine: NEGATIVE
Glucose, UA: NEGATIVE
Hgb urine dipstick: NEGATIVE
Ketones, ur: NEGATIVE
Nitrite: NEGATIVE
Protein, ur: NEGATIVE
Specific Gravity, Urine: 1.028
Urobilinogen, UA: 1
pH: 7

## 2011-03-13 LAB — RAPID URINE DRUG SCREEN, HOSP PERFORMED
Amphetamines: NOT DETECTED
Barbiturates: NOT DETECTED
Benzodiazepines: POSITIVE — AB
Cocaine: POSITIVE — AB
Opiates: POSITIVE — AB
Tetrahydrocannabinol: NOT DETECTED

## 2011-03-13 LAB — HEPATIC FUNCTION PANEL
ALT: 17
AST: 17
Albumin: 3.5
Alkaline Phosphatase: 71
Bilirubin, Direct: <0.1
Total Bilirubin: 0.5
Total Protein: 7.3

## 2011-03-13 LAB — ETHANOL: Alcohol, Ethyl (B): <5

## 2011-03-13 LAB — BASIC METABOLIC PANEL
BUN: 17
CO2: 30
Calcium: 9.4
Chloride: 106
Creatinine, Ser: 0.93
GFR calc Af Amer: >60
GFR calc non Af Amer: >60
Glucose, Bld: 84
Potassium: 4.5
Sodium: 141

## 2011-03-14 ENCOUNTER — Emergency Department (HOSPITAL_COMMUNITY)
Admission: EM | Admit: 2011-03-14 | Discharge: 2011-03-14 | Disposition: A | Discharge status: Home / Self Care | Payer: Medicaid Other | Attending: Emergency Medicine | Admitting: Emergency Medicine

## 2011-03-14 ENCOUNTER — Encounter (HOSPITAL_COMMUNITY): Payer: Self-pay | Admitting: *Deleted

## 2011-03-14 DIAGNOSIS — M549 Dorsalgia, unspecified: Secondary | ICD-10-CM

## 2011-03-14 DIAGNOSIS — F17200 Nicotine dependence, unspecified, uncomplicated: Secondary | ICD-10-CM | POA: Insufficient documentation

## 2011-03-14 MED ORDER — PREDNISONE 10 MG PO TABS: ORAL_TABLET | ORAL | Status: DC

## 2011-03-14 MED ORDER — CYCLOBENZAPRINE HCL 10 MG PO TABS: 10.0000 mg | ORAL_TABLET | Freq: Three times a day (TID) | ORAL | Status: AC | PRN

## 2011-03-14 MED ORDER — OXYCODONE-ACETAMINOPHEN 5-325 MG PO TABS: 1.0000 | ORAL_TABLET | ORAL | Status: AC | PRN

## 2011-03-14 NOTE — ED Notes (Signed)
Pt a/ox4. Resp even and unlabored. NAD at this time. D/C instructions and RX x 3 reviewed with pt. Pt verbalized understanding. Pt ambulated with stead gate to POV.

## 2011-03-14 NOTE — ED Provider Notes (Signed)
History     CSN: [id number] Arrival date & time: 03/14/2011 11:20 AM  Chief Complaint  Patient presents with  . Back Pain    (Consider location/radiation/quality/duration/timing/severity/associated sxs/prior treatment) Patient is a 35 y.o. male presenting with back pain. The history is provided by the patient.  Back Pain  This is a chronic problem. The current episode started more than 1 week ago. The problem occurs constantly. The problem has been gradually worsening. The pain is associated with no known injury. The pain is present in the lumbar spine. The quality of the pain is described as aching and shooting. The pain radiates to the left thigh. The pain is at a severity of 9/10. The pain is moderate. The symptoms are aggravated by bending and certain positions. The pain is the same all the time. Associated symptoms include leg pain. Pertinent negatives include no chest pain, no numbness, no headaches, no abdominal pain, no bowel incontinence, no perianal numbness, no bladder incontinence, no dysuria, no pelvic pain, no paresis, no tingling and no weakness. He has tried analgesics and NSAIDs for the symptoms. The treatment provided no relief.    History reviewed. No pertinent past medical history.  Past Surgical History  Procedure Date  . Hand surgery     History reviewed. No pertinent family history.  History  Substance Use Topics  . Smoking status: Current Everyday Smoker  . Smokeless tobacco: Not on file  . Alcohol Use: No      Review of Systems  Constitutional: Negative for appetite change.  HENT: Negative for neck pain and neck stiffness.   Respiratory: Negative for chest tightness and shortness of breath.   Cardiovascular: Negative for chest pain.  Gastrointestinal: Negative for abdominal pain and bowel incontinence.  Genitourinary: Negative for bladder incontinence, dysuria, urgency, hematuria, flank pain, decreased urine volume, penile pain and pelvic pain.    Musculoskeletal: Positive for back pain. Negative for myalgias and joint swelling.  Neurological: Negative for tingling, weakness, numbness and headaches.  Hematological: Does not bruise/bleed easily.  All other systems reviewed and are negative.    Allergies  Review of patient's allergies indicates no known allergies.  Home Medications   Current Outpatient Rx  Name Route Sig Dispense Refill  . HYDROCODONE-ACETAMINOPHEN 5-325 MG PO TABS  One po QID prn pain 12 tablet 0  . HYDROCODONE-ACETAMINOPHEN 5-325 MG PO TABS  1 or 2 tabs PO q6h prn pain 20 tablet 0    BP 120/75  Pulse 76  Temp(Src) 97.7 F (36.5 C) (Oral)  Resp 18  Ht 5\' 11"  (1.803 m)  Wt 160 lb (72.576 kg)  BMI 22.32 kg/m2  SpO2 99%  Physical Exam  Nursing note and vitals reviewed. Constitutional: He is oriented to person, place, and time. He appears well-developed and well-nourished. No distress.  HENT:  Head: Normocephalic and atraumatic.  Mouth/Throat: Oropharynx is clear and moist.  Neck: Normal range of motion. Neck supple.  Cardiovascular: Normal rate, regular rhythm and normal heart sounds.   Pulmonary/Chest: Effort normal and breath sounds normal.  Abdominal: He exhibits no distension and no mass. There is no tenderness. There is no rebound.  Musculoskeletal: He exhibits tenderness. He exhibits no edema.       Lumbar back: He exhibits tenderness and pain. He exhibits normal range of motion, no swelling, no deformity, no laceration and normal pulse.       Back:  Lymphadenopathy:    He has no cervical adenopathy.  Neurological: He is alert and oriented to person,  place, and time. No cranial nerve deficit or sensory deficit. He exhibits normal muscle tone. Coordination normal.  Reflex Scores:      Patellar reflexes are 2+ on the right side and 2+ on the left side.      Achilles reflexes are 2+ on the right side and 2+ on the left side. Skin: Skin is warm and dry.    ED Course  Procedures (including  critical care time)       MDM        Patient is ambulatory.  No focal neuro deficits on exam.  DTR's nml.  ttp of the left Si joint space and lumbar paraspinal muscles.  PAtient requesting referral to Dr. Hilda Lias.     Medical screening examination/treatment/procedure(s) were performed by non-physician practitioner and as supervising physician I was immediately available for consultation/collaboration. Osvaldo Human, M.D.  Tammy L. [place], [place] 03/15/11 1640  Carleene Cooper III, MD 03/16/11 [phone]

## 2011-03-14 NOTE — ED Notes (Signed)
Pt c/o back pain; pt states he has just received medicaid and is trying to get in to see Dr. Hilda Lias

## 2011-03-25 ENCOUNTER — Encounter (HOSPITAL_COMMUNITY): Payer: Self-pay | Admitting: *Deleted

## 2011-03-25 ENCOUNTER — Emergency Department (HOSPITAL_COMMUNITY)
Admission: EM | Admit: 2011-03-25 | Discharge: 2011-03-25 | Disposition: A | Discharge status: Home / Self Care | Payer: Medicaid Other | Attending: Emergency Medicine | Admitting: Emergency Medicine

## 2011-03-25 DIAGNOSIS — M545 Low back pain, unspecified: Secondary | ICD-10-CM | POA: Insufficient documentation

## 2011-03-25 DIAGNOSIS — G8929 Other chronic pain: Secondary | ICD-10-CM | POA: Insufficient documentation

## 2011-03-25 DIAGNOSIS — F17200 Nicotine dependence, unspecified, uncomplicated: Secondary | ICD-10-CM | POA: Insufficient documentation

## 2011-03-25 MED ORDER — OXYCODONE-ACETAMINOPHEN 5-325 MG PO TABS: 2.0000 | ORAL_TABLET | ORAL | Status: AC | PRN

## 2011-03-25 MED ORDER — PREDNISONE 10 MG PO TABS: 20.0000 mg | ORAL_TABLET | Freq: Every day | ORAL | Status: AC

## 2011-03-25 NOTE — ED Notes (Signed)
Reports occasional numbness in left leg, with shooting pain.  Pain has not increased.

## 2011-03-25 NOTE — ED Provider Notes (Addendum)
History     CSN: [id number] Arrival date & time: 03/25/2011  7:14 PM  Chief Complaint  Patient presents with  . Back Pain    Hurt back pain several weeks ago, unable to see physican, requesting medication refill.     (Consider location/radiation/quality/duration/timing/severity/associated sxs/prior treatment) HPI Comments: Was lifting heavy shingles when symptoms began one month ago.  Since then, he has had worsening pain.  Has been having difficulty with appointments due to insurance reasons.  Patient is a 35 y.o. male presenting with back pain. The history is provided by the patient.  Back Pain  This is a chronic problem. The current episode started more than 1 week ago. The problem occurs constantly. The problem has not changed since onset.The pain is associated with lifting heavy objects. The pain is present in the lumbar spine. The quality of the pain is described as shooting and stabbing. The pain does not radiate. The pain is at a severity of 10/10. The pain is severe. The symptoms are aggravated by bending, twisting and certain positions. The pain is the same all the time. Pertinent negatives include no fever, no bowel incontinence and no bladder incontinence. He has tried NSAIDs and analgesics for the symptoms.    History reviewed. No pertinent past medical history.  Past Surgical History  Procedure Date  . Hand surgery   . Hand surgery     History reviewed. No pertinent family history.  History  Substance Use Topics  . Smoking status: Current Everyday Smoker -- 1.0 packs/day  . Smokeless tobacco: Not on file  . Alcohol Use: No      Review of Systems  Constitutional: Negative for fever.  Gastrointestinal: Negative for bowel incontinence.  Genitourinary: Negative for bladder incontinence.  Musculoskeletal: Positive for back pain.    Allergies  Review of patient's allergies indicates no known allergies.  Home Medications   Current Outpatient Rx  Name Route  Sig Dispense Refill  . CYCLOBENZAPRINE HCL 10 MG PO TABS Oral Take 1 tablet (10 mg total) by mouth 3 (three) times daily as needed for muscle spasms. 21 tablet 0  . HYDROCODONE-ACETAMINOPHEN 5-325 MG PO TABS  One po QID prn pain 12 tablet 0  . OXYCODONE-ACETAMINOPHEN 5-325 MG PO TABS Oral Take 1 tablet by mouth every 4 (four) hours as needed for pain. 15 tablet 0  . PREDNISONE 10 MG PO TABS  Take 6 tablets day one, 5 tablets day two, 4 tablets day three, 3 tablets day four, 2 tablets day five, then 1 tablet day six 21 tablet 0    BP 128/72  Pulse 69  Temp(Src) 97.4 F (36.3 C) (Oral)  Resp 20  Ht 5\' 11"  (1.803 m)  Wt 160 lb (72.576 kg)  BMI 22.32 kg/m2  SpO2 100%  Physical Exam  Constitutional: He is oriented to person, place, and time. He appears well-developed and well-nourished. No distress.  HENT:  Head: Normocephalic and atraumatic.  Neck: Normal range of motion. Neck supple.  Abdominal: Soft. There is no tenderness.  Musculoskeletal: Normal range of motion.  Neurological: He is alert and oriented to person, place, and time.       DTR's are 2+ and equal in the ble.  Strength is 5/5 in the ble.  Ambulatory without difficulty.  Skin: Skin is warm and dry. He is not diaphoretic.    ED Course  Procedures (including critical care time)  Labs Reviewed - No data to display No results found.   No diagnosis found.  MDM          Geoffery Lyons, MD 04/03/11 1318  Geoffery Lyons, MD 04/04/11 [phone]

## 2011-03-26 ENCOUNTER — Emergency Department: Payer: Self-pay | Admitting: Emergency Medicine

## 2011-04-02 LAB — URINALYSIS, ROUTINE W REFLEX MICROSCOPIC
Glucose, UA: NEGATIVE
Glucose, UA: NEGATIVE
Ketones, ur: NEGATIVE
Ketones, ur: NEGATIVE
Leukocytes, UA: NEGATIVE
Leukocytes, UA: NEGATIVE
Nitrite: NEGATIVE
Nitrite: NEGATIVE
Protein, ur: NEGATIVE
Protein, ur: NEGATIVE
Specific Gravity, Urine: 1.025
Specific Gravity, Urine: >1.03 — ABNORMAL HIGH
Urobilinogen, UA: 0.2
Urobilinogen, UA: 0.2
pH: 6
pH: 6

## 2011-04-02 LAB — DIFFERENTIAL
Basophils Absolute: 0.1
Basophils Relative: 1
Eosinophils Absolute: 0.2
Eosinophils Relative: 3
Lymphocytes Relative: 28
Lymphs Abs: 2.4
Monocytes Absolute: 0.7
Monocytes Relative: 8
Neutro Abs: 5
Neutrophils Relative %: 60

## 2011-04-02 LAB — COMPREHENSIVE METABOLIC PANEL
ALT: 27
AST: 20
Albumin: 3.7
Alkaline Phosphatase: 103
BUN: 16
CO2: 28
Calcium: 8.9
Chloride: 106
Creatinine, Ser: 1.03
GFR calc Af Amer: >60
GFR calc non Af Amer: >60
Glucose, Bld: 116 — ABNORMAL HIGH
Potassium: 4.1
Sodium: 134 — ABNORMAL LOW
Total Bilirubin: 1
Total Protein: 7

## 2011-04-02 LAB — RAPID URINE DRUG SCREEN, HOSP PERFORMED
Amphetamines: NOT DETECTED
Barbiturates: NOT DETECTED
Benzodiazepines: POSITIVE — AB
Cocaine: NOT DETECTED
Opiates: POSITIVE — AB
Tetrahydrocannabinol: NOT DETECTED

## 2011-04-02 LAB — CBC
HCT: 45.5
Hemoglobin: 16.3
MCHC: 35.9
MCV: 90.2
Platelets: 267
RBC: 5.05
RDW: 12.9
WBC: 8.3

## 2011-04-02 LAB — URINE MICROSCOPIC-ADD ON

## 2011-10-13 ENCOUNTER — Emergency Department (HOSPITAL_COMMUNITY): Payer: Medicaid Other

## 2011-10-13 ENCOUNTER — Encounter (HOSPITAL_COMMUNITY): Payer: Self-pay | Admitting: *Deleted

## 2011-10-13 ENCOUNTER — Emergency Department (HOSPITAL_COMMUNITY)
Admission: EM | Admit: 2011-10-13 | Discharge: 2011-10-13 | Disposition: A | Discharge status: Home / Self Care | Payer: Medicaid Other | Attending: Emergency Medicine | Admitting: Emergency Medicine

## 2011-10-13 DIAGNOSIS — M79609 Pain in unspecified limb: Secondary | ICD-10-CM | POA: Insufficient documentation

## 2011-10-13 DIAGNOSIS — M79673 Pain in unspecified foot: Secondary | ICD-10-CM

## 2011-10-13 DIAGNOSIS — M25579 Pain in unspecified ankle and joints of unspecified foot: Secondary | ICD-10-CM | POA: Insufficient documentation

## 2011-10-13 MED ORDER — IBUPROFEN 800 MG PO TABS
800.0000 mg | ORAL_TABLET | Freq: Once | ORAL | Status: AC
  Administered 2011-10-13: 800 mg via ORAL
  Filled 2011-10-13: qty 1

## 2011-10-13 MED ORDER — OXYCODONE-ACETAMINOPHEN 5-325 MG PO TABS
1.0000 | ORAL_TABLET | Freq: Once | ORAL | Status: AC
  Administered 2011-10-13: 1 via ORAL
  Filled 2011-10-13: qty 1

## 2011-10-13 MED ORDER — HYDROCODONE-ACETAMINOPHEN 5-325 MG PO TABS
ORAL_TABLET | ORAL | Status: DC
Start: 2011-10-13 — End: 2011-10-13

## 2011-10-13 MED ORDER — IBUPROFEN 800 MG PO TABS: 800.0000 mg | ORAL_TABLET | Freq: Three times a day (TID) | ORAL | Status: AC

## 2011-10-13 MED ORDER — OXYCODONE-ACETAMINOPHEN 5-325 MG PO TABS: 1.0000 | ORAL_TABLET | ORAL | Status: AC | PRN

## 2011-10-13 NOTE — ED Notes (Signed)
Achilles tendon and heel pain Injury when playing ball with children.

## 2011-10-13 NOTE — ED Provider Notes (Signed)
History     CSN: [id number]  Arrival date & time 10/13/11  1857   First MD Initiated Contact with Patient 10/13/11 2017      Chief Complaint  Patient presents with  . Ankle Pain    (Consider location/radiation/quality/duration/timing/severity/associated sxs/prior treatment) HPI Comments: Patient c/o pain to his right ankle and heel after playing basketball. Denies fall.   He also denies numbness, weakness, or calf pain.  He has not tried ice or any medications for the pain.     Patient is a 36 y.o. male presenting with ankle pain. The history is provided by the patient.  Ankle Pain  The incident occurred 6 to 12 hours ago. The incident occurred at home. The injury mechanism was torsion. The pain is present in the right ankle and right foot. The quality of the pain is described as aching. The pain is mild. The pain has been constant since onset. Pertinent negatives include no numbness, no inability to bear weight, no loss of motion, no muscle weakness, no loss of sensation and no tingling. He reports no foreign bodies present. The symptoms are aggravated by palpation and bearing weight. He has tried nothing for the symptoms. The treatment provided no relief.    History reviewed. No pertinent past medical history.  Past Surgical History  Procedure Date  . Hand surgery   . Hand surgery     Family History  Problem Relation Age of Onset  . Diabetes Mother   . Diabetes Father     History  Substance Use Topics  . Smoking status: Current Everyday Smoker -- 1.0 packs/day  . Smokeless tobacco: Not on file  . Alcohol Use: No      Review of Systems  Constitutional: Negative for fever, chills and fatigue.  Respiratory: Negative for cough.   Musculoskeletal: Positive for arthralgias and gait problem. Negative for myalgias, back pain and joint swelling.  Skin: Negative for color change and rash.  Neurological: Negative for dizziness, tingling, weakness and numbness.    Hematological: Does not bruise/bleed easily.  All other systems reviewed and are negative.    Allergies  Tramadol  Home Medications   Current Outpatient Rx  Name Route Sig Dispense Refill  . HYDROCODONE-ACETAMINOPHEN 5-325 MG PO TABS  One po QID prn pain 12 tablet 0  . PREDNISONE 10 MG PO TABS  Take 6 tablets day one, 5 tablets day two, 4 tablets day three, 3 tablets day four, 2 tablets day five, then 1 tablet day six 21 tablet 0    BP 140/89  Pulse 84  Temp(Src) 97.9 F (36.6 C) (Oral)  Resp 20  Ht 5\' 11"  (1.803 m)  Wt 165 lb (74.844 kg)  BMI 23.01 kg/m2  SpO2 100%  Physical Exam  Nursing note and vitals reviewed. Constitutional: He is oriented to person, place, and time. He appears well-developed and well-nourished. No distress.  Cardiovascular: Normal rate, regular rhythm and normal heart sounds.   Pulmonary/Chest: Breath sounds normal. No respiratory distress.  Musculoskeletal: He exhibits tenderness. He exhibits no edema.       Right ankle: He exhibits normal range of motion, no swelling, no ecchymosis, no deformity, no laceration and normal pulse. tenderness. Lateral malleolus and medial malleolus tenderness found. No head of 5th metatarsal and no proximal fibula tenderness found. Achilles tendon exhibits pain. Achilles tendon exhibits no defect and normal Thompson's test results.       Feet:       ttp of the lateral and medial aspect  of the right ankle and distal portion of the Achilles tendon.  Tendon appears intact.  No deformity, no calf pain or swelling.  DP pulse is brisk, sensation intact.    Neurological: He is alert and oriented to person, place, and time. He exhibits normal muscle tone. Coordination normal.  Skin: Skin is warm and dry.    ED Course  Procedures (including critical care time)  Labs Reviewed - No data to display Dg Ankle Complete Right  10/13/2011  *RADIOLOGY REPORT*  Clinical Data: Right ankle pain post fall  RIGHT ANKLE - COMPLETE 3+ VIEW   Comparison: None.  Findings: No fracture or dislocation.  Regional soft tissues are normal.  Ankle mortise is preserved.  Joint spaces are preserved. No definite ankle joint effusion.  IMPRESSION: No fracture.  Original Report Authenticated By: Waynard Reeds, M.D.    Posterior LE splint applied, crutches given.  Pain improved, remains NV intact.   MDM    Previous medical charts, nursing notes and vitals signs from this visit were reviewed by me   All laboratory results and/or imaging results performed on this visit, if applicable, were reviewed by me and discussed with the patient and/or parent as well as recommendation for follow-up    MEDICATIONS GIVEN IN ED:  Percocet and ibuprofen po   ttp of the posterior ankle,  heel and distal portion of the Achilles tendon.  No calf pain or deformity.  Tendon appears intact.  Pain improved with the splint.  Pt agrees to f/u with Dr. Romeo Apple.        PRESCRIPTIONS GIVEN AT DISCHARGE:  Hydrocodone, ibuprofen     Pt stable in ED with no significant deterioration in condition. Pt feels improved after observation and/or treatment in ED. Patient / Family / Caregiver understand and agree with initial ED impression and plan with expectations set for ED visit.  Patient agrees to return to ED for any worsening symptoms        Josean Lycan L. [place], [place] 10/17/11 1757

## 2011-10-18 NOTE — ED Provider Notes (Signed)
Medical screening examination/treatment/procedure(s) were performed by non-physician practitioner and as supervising physician I was immediately available for consultation/collaboration.  Jamila Slatten S. Drevion Offord, MD 10/18/11 2322 

## 2011-10-26 ENCOUNTER — Emergency Department (HOSPITAL_COMMUNITY): Payer: Medicaid Other

## 2011-10-26 ENCOUNTER — Encounter (HOSPITAL_COMMUNITY): Payer: Self-pay

## 2011-10-26 ENCOUNTER — Emergency Department (HOSPITAL_COMMUNITY)
Admission: EM | Admit: 2011-10-26 | Discharge: 2011-10-26 | Disposition: A | Discharge status: Home / Self Care | Payer: Medicaid Other | Attending: Emergency Medicine | Admitting: Emergency Medicine

## 2011-10-26 DIAGNOSIS — M7661 Achilles tendinitis, right leg: Secondary | ICD-10-CM

## 2011-10-26 DIAGNOSIS — M7660 Achilles tendinitis, unspecified leg: Secondary | ICD-10-CM | POA: Insufficient documentation

## 2011-10-26 MED ORDER — OXYCODONE-ACETAMINOPHEN 5-325 MG PO TABS
1.0000 | ORAL_TABLET | Freq: Once | ORAL | Status: AC
  Administered 2011-10-26: 1 via ORAL
  Filled 2011-10-26: qty 1

## 2011-10-26 MED ORDER — OXYCODONE-ACETAMINOPHEN 5-325 MG PO TABS: 1.0000 | ORAL_TABLET | ORAL | Status: AC | PRN

## 2011-10-26 NOTE — ED Notes (Signed)
Pt a/ox4. Resp even and unlabored. NAD at this time. D/C instructions reviewed with pt. Pt verbalized understanding. Pt ambulated to lobby with steady gate.  

## 2011-10-26 NOTE — ED Notes (Signed)
Complain of pain in right heel 

## 2011-10-26 NOTE — ED Provider Notes (Signed)
History     CSN: [id number]  Arrival date & time 10/26/11  1751   First MD Initiated Contact with Patient 10/26/11 1906      Chief Complaint  Patient presents with  . Foot Pain    (Consider location/radiation/quality/duration/timing/severity/associated sxs/prior treatment) HPI Comments: Had an achilles tendon injury a couple months ago.  It was feeling "a lot better".   He works on [employer] and raced a co-worker up the stairs and again has pain.  The history is provided by the patient. No language interpreter was used.    History reviewed. No pertinent past medical history.  Past Surgical History  Procedure Date  . Hand surgery   . Hand surgery     Family History  Problem Relation Age of Onset  . Diabetes Mother   . Diabetes Father     History  Substance Use Topics  . Smoking status: Current Everyday Smoker -- 1.0 packs/day  . Smokeless tobacco: Not on file  . Alcohol Use: No      Review of Systems  Neurological: Negative for weakness and numbness.  Psychiatric/Behavioral: Self-injury: R achilles injury.  All other systems reviewed and are negative.    Allergies  Tramadol  Home Medications   Current Outpatient Rx  Name Route Sig Dispense Refill  . HYDROCODONE-ACETAMINOPHEN 5-325 MG PO TABS  One po QID prn pain 12 tablet 0  . OXYCODONE-ACETAMINOPHEN 5-325 MG PO TABS Oral Take 1 tablet by mouth every 4 (four) hours as needed for pain. 10 tablet 0  . PREDNISONE 10 MG PO TABS  Take 6 tablets day one, 5 tablets day two, 4 tablets day three, 3 tablets day four, 2 tablets day five, then 1 tablet day six 21 tablet 0    BP 99/56  Pulse 86  Temp 98.9 F (37.2 C)  Resp 18  Ht 5\' 11"  (1.803 m)  Wt 165 lb (74.844 kg)  BMI 23.01 kg/m2  SpO2 99%  Physical Exam  Nursing note and vitals reviewed. Constitutional: He is oriented to person, place, and time. He appears well-developed and well-nourished.  HENT:  Head: Normocephalic and atraumatic.  Eyes: EOM  are normal.  Neck: Normal range of motion.  Cardiovascular: Normal rate, regular rhythm, normal heart sounds and intact distal pulses.   Pulmonary/Chest: Effort normal and breath sounds normal. No respiratory distress.  Abdominal: Soft. He exhibits no distension. There is no tenderness.  Musculoskeletal: He exhibits tenderness.       Right ankle: Achilles tendon exhibits pain. Achilles tendon exhibits no defect and normal Thompson's test results.       Right foot: He exhibits decreased range of motion, tenderness and bony tenderness. He exhibits no swelling, no crepitus and no deformity.       Feet:  Neurological: He is alert and oriented to person, place, and time.  Skin: Skin is warm and dry.  Psychiatric: He has a normal mood and affect. Judgment normal.    ED Course  Procedures (including critical care time)  Labs Reviewed - No data to display Dg Foot Complete Right  10/26/2011  *RADIOLOGY REPORT*  Clinical Data: Avulsion fracture.  Achilles pain.  RIGHT FOOT COMPLETE - 3+ VIEW  Comparison: None.  Findings: The Achilles shadow appears within normal limits. Anatomic alignment of the bones of the right foot.  There is no fracture identified.  IMPRESSION: Negative.  Original Report Authenticated By: Andreas Newport, M.D.     1. Achilles tendonitis, right  MDM  Wear the heel splint and use your crutches as needed.  Apply ice several times daily.  F/u dr. Hilda Lias prn.        Worthy Rancher, PA 10/26/11 2102  Worthy Rancher, PA 10/29/11 [phone]

## 2011-10-26 NOTE — Discharge Instructions (Signed)
Achilles Tendinitis Tendinitis a swelling and soreness of the tendon. The pain in the tendon (cord-like structure which attaches muscle to bone) is produced by tiny tears and the inflammation present in that tendon. It commonly occurs at the shoulders, heels, and elbows. It is usually caused by overusing the tendon and joint involved. Achilles tendinitis involves the Achilles tendon. This is the large tendon in the back of the leg just above the foot. It attaches the large muscles of the lower leg to the heel bone (called calcaneus).  This diagnosis (learning what is wrong) is made by examination. X-rays will be generally be normal if only tendinitis is present. HOME CARE INSTRUCTIONS   Apply ice to the injury for 15 to 20 minutes, 3 to 4 times per day. Put the ice in a plastic bag and place a towel between the bag of ice and your skin.   Try to avoid use other than gentle range of motion while the tendon is painful. Do not resume use until instructed by your caregiver. Then begin use gradually. Do not increase use to the point of pain. If pain does develop, decrease use and continue the above measures. Gradually increase activities that do not cause discomfort until you gradually achieve normal use.   Only take over-the-counter or prescription medicines for pain, discomfort, or fever as directed by your caregiver.  SEEK MEDICAL CARE IF:   Your pain and swelling increase or pain is uncontrolled with medications.   You develop new, unexplained problems (symptoms) or an increase of the symptoms that brought you to your caregiver.   You develop an inability to move your toes or foot, develop warmth and swelling in your foot, or begin running an unexplained temperature.  MAKE SURE YOU:   Understand these instructions.   Will watch your condition.   Will get help right away if you are not doing well or get worse.  Document Released: 03/12/2005 Document Revised: 05/22/2011 Document Reviewed:  01/19/2008 [facility] Patient Information 2012 [place], [place].   Take the pain medicine as directed.  Take ibuprofen 800 mg every 8 hrs with food.  Wear your heel splint and crutches as needed.  Follow up with dr. Hilda Lias or the orthopedist of your choice.

## 2011-10-31 NOTE — ED Provider Notes (Signed)
Medical screening examination/treatment/procedure(s) were performed by non-physician practitioner and as supervising physician I was immediately available for consultation/collaboration.   Carleene Cooper III, MD 10/31/11 2225

## 2011-12-20 ENCOUNTER — Emergency Department (HOSPITAL_COMMUNITY)
Admission: EM | Admit: 2011-12-20 | Discharge: 2011-12-20 | Disposition: A | Discharge status: Home / Self Care | Payer: Medicaid Other | Attending: Emergency Medicine | Admitting: Emergency Medicine

## 2011-12-20 ENCOUNTER — Encounter (HOSPITAL_COMMUNITY): Payer: Self-pay

## 2011-12-20 DIAGNOSIS — F17200 Nicotine dependence, unspecified, uncomplicated: Secondary | ICD-10-CM | POA: Insufficient documentation

## 2011-12-20 DIAGNOSIS — IMO0002 Reserved for concepts with insufficient information to code with codable children: Secondary | ICD-10-CM | POA: Insufficient documentation

## 2011-12-20 DIAGNOSIS — L02413 Cutaneous abscess of right upper limb: Secondary | ICD-10-CM

## 2011-12-20 MED ORDER — LIDOCAINE HCL (PF) 1 % IJ SOLN
INTRAMUSCULAR | Status: AC
  Administered 2011-12-20: 18:00:00
  Filled 2011-12-20: qty 5

## 2011-12-20 MED ORDER — DOXYCYCLINE HYCLATE 100 MG PO CAPS: 100.0000 mg | ORAL_CAPSULE | Freq: Two times a day (BID) | ORAL | Status: AC

## 2011-12-20 MED ORDER — HYDROCODONE-ACETAMINOPHEN 7.5-325 MG PO TABS: 1.0000 | ORAL_TABLET | ORAL | Status: AC | PRN

## 2011-12-20 NOTE — ED Provider Notes (Signed)
History     CSN: [id number]  Arrival date & time 12/20/11  1546   First MD Initiated Contact with Patient 12/20/11 1749      Chief Complaint  Patient presents with  . Abscess    (Consider location/radiation/quality/duration/timing/severity/associated sxs/prior treatment) Patient is a 36 y.o. male presenting with abscess. The history is provided by the patient.  Abscess  This is a new problem. The current episode started less than one week ago. The problem occurs continuously. The problem has been gradually worsening. The abscess is present on the right arm. The problem is moderate. The abscess is characterized by redness and painfulness. It is unknown what he was exposed to. The abscess first occurred at work. Associated symptoms include decreased sleep. Pertinent negatives include no anorexia. There were no sick contacts.    History reviewed. No pertinent past medical history.  Past Surgical History  Procedure Date  . Hand surgery   . Hand surgery     Family History  Problem Relation Age of Onset  . Diabetes Mother   . Diabetes Father     History  Substance Use Topics  . Smoking status: Current Everyday Smoker -- 1.0 packs/day  . Smokeless tobacco: Not on file  . Alcohol Use: No      Review of Systems  Gastrointestinal: Negative for anorexia.  Musculoskeletal:       Hand injury    Allergies  Biaxin and Tramadol  Home Medications   Current Outpatient Rx  Name Route Sig Dispense Refill  . DOXYCYCLINE HYCLATE 100 MG PO CAPS Oral Take 1 capsule (100 mg total) by mouth 2 (two) times daily. 14 capsule 0  . HYDROCODONE-ACETAMINOPHEN 5-325 MG PO TABS  One po QID prn pain 12 tablet 0  . HYDROCODONE-ACETAMINOPHEN 7.5-325 MG PO TABS Oral Take 1 tablet by mouth every 4 (four) hours as needed for pain. 15 tablet 0  . PREDNISONE 10 MG PO TABS  Take 6 tablets day one, 5 tablets day two, 4 tablets day three, 3 tablets day four, 2 tablets day five, then 1 tablet day six 21  tablet 0    BP 120/70  Pulse 84  Temp 97.5 F (36.4 C) (Oral)  Resp 16  Ht 5\' 11"  (1.803 m)  Wt 165 lb (74.844 kg)  BMI 23.01 kg/m2  SpO2 98%  Physical Exam  Nursing note and vitals reviewed. Constitutional: He is oriented to person, place, and time. He appears well-developed and well-nourished.  Non-toxic appearance.  HENT:  Head: Normocephalic.  Right Ear: Tympanic membrane and external ear normal.  Left Ear: Tympanic membrane and external ear normal.  Eyes: EOM and lids are normal. Pupils are equal, round, and reactive to light.  Neck: Normal range of motion. Neck supple. Carotid bruit is not present.  Cardiovascular: Normal rate, regular rhythm, normal heart sounds, intact distal pulses and normal pulses.   Pulmonary/Chest: Breath sounds normal. No respiratory distress.  Abdominal: Soft. Bowel sounds are normal. There is no tenderness. There is no guarding.  Musculoskeletal: Normal range of motion.       Abscerss of the right mid forearm. No red streaking. Mild drainage present.  Lymphadenopathy:       Head (right side): No submandibular adenopathy present.       Head (left side): No submandibular adenopathy present.    He has no cervical adenopathy.  Neurological: He is alert and oriented to person, place, and time. He has normal strength. No cranial nerve deficit or sensory deficit.  Skin: Skin is warm and dry.  Psychiatric: He has a normal mood and affect. His speech is normal.    ED Course  INCISION AND DRAINAGE Date/Time: 12/20/2011 6:17 PM Performed by: Kathie Dike Authorized by: Kathie Dike Consent: Verbal consent obtained. Risks and benefits: risks, benefits and alternatives were discussed Consent given by: patient Patient understanding: patient states understanding of the procedure being performed Patient identity confirmed: arm band Time out: Immediately prior to procedure a "time out" was called to verify the correct patient, procedure, equipment,  support staff and site/side marked as required. Type: abscess Body area: upper extremity Location details: right arm Anesthesia: local infiltration Local anesthetic: lidocaine 1% without epinephrine Patient sedated: no Scalpel size: 15 Incision type: single straight Complexity: complex Drainage: purulent Drainage amount: moderate Packing material: 1/4 in iodoform gauze Patient tolerance: Patient tolerated the procedure well with no immediate complications.   (including critical care time)   Labs Reviewed  CULTURE, ROUTINE-ABSCESS   No results found.   1. Abscess of arm, right       MDM  I have reviewed nursing notes, vital signs, and all appropriate lab and imaging results for this patient. Pt has an abscess of the right forearm. I and D carried out. Pt to have packing removed July 8. Rx for doxycycline and Norco #15 given to the patient.       Kathie Dike, [place] 12/20/11 1820

## 2011-12-20 NOTE — ED Notes (Signed)
Complain of abscess to right forearm

## 2011-12-20 NOTE — ED Notes (Signed)
Pt. States he noticed swelling like a pimple 3d ago.  It progressively enlarged, w/redness and pain until today.  While cutting trees, he nicked it and it popped, releasing a large amount of tan-green fluid.

## 2011-12-20 NOTE — ED Notes (Signed)
Patient with no complaints at this time. Respirations even and unlabored. Skin warm/dry. Discharge instructions reviewed with patient at this time. Patient given opportunity to voice concerns/ask questions. Patient discharged at this time and left Emergency Department with steady gait.   

## 2011-12-20 NOTE — ED Provider Notes (Signed)
Medical screening examination/treatment/procedure(s) were performed by non-physician practitioner and as supervising physician I was immediately available for consultation/collaboration.   Laray Anger, DO 12/20/11 2038

## 2011-12-24 LAB — CULTURE, ROUTINE-ABSCESS

## 2011-12-24 NOTE — ED Notes (Signed)
[facility]-called from [facility] lab.

## 2011-12-24 NOTE — ED Notes (Addendum)
I/D done-patient treated with Doxycyline-sensitive to same-chart appended per protocol MD.Patient does not live there.But message left for patient to return call.

## 2012-01-19 ENCOUNTER — Emergency Department (HOSPITAL_COMMUNITY)
Admission: EM | Admit: 2012-01-19 | Discharge: 2012-01-19 | Disposition: A | Discharge status: Home / Self Care | Payer: Medicaid Other | Attending: Emergency Medicine | Admitting: Emergency Medicine

## 2012-01-19 ENCOUNTER — Encounter (HOSPITAL_COMMUNITY): Payer: Self-pay | Admitting: *Deleted

## 2012-01-19 DIAGNOSIS — IMO0002 Reserved for concepts with insufficient information to code with codable children: Secondary | ICD-10-CM | POA: Insufficient documentation

## 2012-01-19 DIAGNOSIS — L0291 Cutaneous abscess, unspecified: Secondary | ICD-10-CM

## 2012-01-19 DIAGNOSIS — Z882 Allergy status to sulfonamides status: Secondary | ICD-10-CM | POA: Insufficient documentation

## 2012-01-19 DIAGNOSIS — Z886 Allergy status to analgesic agent status: Secondary | ICD-10-CM | POA: Insufficient documentation

## 2012-01-19 DIAGNOSIS — F17200 Nicotine dependence, unspecified, uncomplicated: Secondary | ICD-10-CM | POA: Insufficient documentation

## 2012-01-19 MED ORDER — OXYCODONE-ACETAMINOPHEN 5-325 MG PO TABS: 1.0000 | ORAL_TABLET | ORAL | Status: AC | PRN

## 2012-01-19 MED ORDER — LIDOCAINE HCL (PF) 1 % IJ SOLN
5.0000 mL | Freq: Once | INTRAMUSCULAR | Status: AC
  Administered 2012-01-19: 5 mL via INTRADERMAL
  Filled 2012-01-19: qty 5

## 2012-01-19 MED ORDER — OXYCODONE-ACETAMINOPHEN 5-325 MG PO TABS
1.0000 | ORAL_TABLET | Freq: Once | ORAL | Status: AC
  Administered 2012-01-19: 1 via ORAL
  Filled 2012-01-19: qty 1

## 2012-01-19 MED ORDER — DOXYCYCLINE HYCLATE 100 MG PO TABS
100.0000 mg | ORAL_TABLET | Freq: Once | ORAL | Status: AC
  Administered 2012-01-19: 100 mg via ORAL
  Filled 2012-01-19: qty 1

## 2012-01-19 MED ORDER — DOXYCYCLINE HYCLATE 100 MG PO CAPS: 100.0000 mg | ORAL_CAPSULE | Freq: Two times a day (BID) | ORAL | Status: AC

## 2012-01-19 NOTE — ED Provider Notes (Signed)
History     CSN: [id number]  Arrival date & time 01/19/12  1940   First MD Initiated Contact with Patient 01/19/12 2019      Chief Complaint  Patient presents with  . Abscess    (Consider location/radiation/quality/duration/timing/severity/associated sxs/prior treatment) HPI Comments: Patient c/o redness and pain to his left forearm and right wrist for 2-3 days.  States he is unsure if the lesions may be caused by exposure to paint thinner.  He denies previus h/o MRSA.  He also denies fever, recent tick bite, or joint pains.    Patient is a 36 y.o. male presenting with abscess. The history is provided by the patient.  Abscess  This is a new problem. The current episode started less than one week ago. The onset was gradual. The problem occurs continuously. The problem has been gradually worsening. The abscess is present on the right wrist and left arm. The problem is mild. The abscess is characterized by redness, painfulness and swelling. Pertinent negatives include no decrease in physical activity, no fever and no vomiting. His past medical history does not include skin abscesses in family. There were no sick contacts. He has received no recent medical care.    History reviewed. No pertinent past medical history.  Past Surgical History  Procedure Date  . Hand surgery   . Hand surgery     Family History  Problem Relation Age of Onset  . Diabetes Mother   . Diabetes Father     History  Substance Use Topics  . Smoking status: Current Everyday Smoker -- 1.0 packs/day  . Smokeless tobacco: Not on file  . Alcohol Use: No      Review of Systems  Constitutional: Negative for fever and chills.  Gastrointestinal: Negative for nausea and vomiting.  Musculoskeletal: Negative for joint swelling and arthralgias.  Skin: Positive for color change. Negative for wound.       Abscess   Hematological: Negative for adenopathy.  All other systems reviewed and are  negative.    Allergies  Tramadol and Biaxin  Home Medications   Current Outpatient Rx  Name Route Sig Dispense Refill  . IBUPROFEN 200 MG PO TABS Oral Take 200 mg by mouth every 6 (six) hours as needed.      BP 134/75  Pulse 93  Temp 99 F (37.2 C) (Oral)  Resp 18  Ht 5\' 11"  (1.803 m)  Wt 160 lb (72.576 kg)  BMI 22.32 kg/m2  SpO2 97%  Physical Exam  Nursing note and vitals reviewed. Constitutional: He is oriented to person, place, and time. He appears well-developed and well-nourished. No distress.  HENT:  Head: Normocephalic and atraumatic.  Cardiovascular: Normal rate, regular rhythm and normal heart sounds.   Pulmonary/Chest: Effort normal and breath sounds normal.  Musculoskeletal:       Right wrist: He exhibits tenderness. He exhibits normal range of motion, no bony tenderness, no effusion, no crepitus, no deformity and no laceration.       Arms:      Dime sized erythematous pustule to the volar aspect of the right wrist.  No significant surrounding erythema, mild fluctuance.  <2 cm scabbed lesion to the left forearm.  No edema   Neurological: He is alert and oriented to person, place, and time. He exhibits normal muscle tone. Coordination normal.  Skin: Skin is warm. There is erythema.       See MS exam    ED Course  Procedures (including critical care time)  Labs  Reviewed - No data to display No results found.      MDM    INCISION AND DRAINAGE Performed by: Maxwell Caul. Consent: Verbal consent obtained. Risks and benefits: risks, benefits and alternatives were discussed Type: abscess  Body area: right wrist Anesthesia: local infiltration  Local anesthetic: lidocaine 1% w/o epinephrine  Anesthetic total: 2 ml  Complexity: complex Blunt dissection to break up loculations  Drainage: purulent  Drainage amount: small Packing material: 1/4 in iodoform gauze  Patient tolerance: Patient tolerated the procedure well with no immediate  complications.    Lesion to the left forearm , no I&D needed at this time.  Agrees to return here in 2 days for recheck and packing removal.    The patient appears reasonably screened and/or stabilized for discharge and I doubt any other medical condition or other [facility] requiring further screening, evaluation, or treatment in the ED at this time prior to discharge.   Prescribed: Doxy Percocet #15       Bonham Zingale L. [place], [place] 01/21/12 2157

## 2012-01-19 NOTE — ED Notes (Signed)
Abscess to rt forearm and lt forearm. Present x 2 days

## 2012-01-22 ENCOUNTER — Emergency Department (HOSPITAL_COMMUNITY)
Admission: EM | Admit: 2012-01-22 | Discharge: 2012-01-22 | Disposition: A | Discharge status: Home / Self Care | Payer: Medicaid Other | Attending: Emergency Medicine | Admitting: Emergency Medicine

## 2012-01-22 ENCOUNTER — Encounter (HOSPITAL_COMMUNITY): Payer: Self-pay | Admitting: *Deleted

## 2012-01-22 DIAGNOSIS — IMO0002 Reserved for concepts with insufficient information to code with codable children: Secondary | ICD-10-CM | POA: Insufficient documentation

## 2012-01-22 DIAGNOSIS — L0291 Cutaneous abscess, unspecified: Secondary | ICD-10-CM

## 2012-01-22 DIAGNOSIS — F17200 Nicotine dependence, unspecified, uncomplicated: Secondary | ICD-10-CM | POA: Insufficient documentation

## 2012-01-22 MED ORDER — HYDROCODONE-ACETAMINOPHEN 5-325 MG PO TABS: 1.0000 | ORAL_TABLET | Freq: Four times a day (QID) | ORAL | Status: AC | PRN

## 2012-01-22 MED ORDER — IBUPROFEN 800 MG PO TABS
800.0000 mg | ORAL_TABLET | Freq: Once | ORAL | Status: AC
  Administered 2012-01-22: 800 mg via ORAL
  Filled 2012-01-22: qty 1

## 2012-01-22 MED ORDER — HYDROCODONE-ACETAMINOPHEN 5-325 MG PO TABS
1.0000 | ORAL_TABLET | Freq: Once | ORAL | Status: AC
  Administered 2012-01-22: 1 via ORAL
  Filled 2012-01-22: qty 1

## 2012-01-22 MED ORDER — DOXYCYCLINE HYCLATE 100 MG PO TABS
100.0000 mg | ORAL_TABLET | Freq: Once | ORAL | Status: AC
  Administered 2012-01-22: 100 mg via ORAL
  Filled 2012-01-22: qty 1

## 2012-01-22 MED ORDER — DOXYCYCLINE HYCLATE 100 MG PO CAPS: 100.0000 mg | ORAL_CAPSULE | Freq: Two times a day (BID) | ORAL | Status: AC

## 2012-01-22 NOTE — ED Provider Notes (Signed)
Medical screening examination/treatment/procedure(s) were performed by non-physician practitioner and as supervising physician I was immediately available for consultation/collaboration.  Donnetta Hutching, MD 01/22/12 1820

## 2012-01-22 NOTE — ED Notes (Signed)
Pt with abscess to left forearm, started off small and has gotten worse, was seen recently for abscess to right arm

## 2012-01-22 NOTE — ED Provider Notes (Signed)
History     CSN: [id number]  Arrival date & time 01/22/12  1329   None     Chief Complaint  Patient presents with  . 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  No fever or chills.  Patient is a 36 y.o. male presenting with abscess. The history is provided by the patient. No language interpreter was used.  Abscess  This is a new problem. Episode onset: several days ago. The problem has been gradually worsening. The abscess is present on the left arm. The problem is moderate. The abscess is characterized by redness and swelling. Pertinent negatives include no fever. There were no sick contacts. He has received no recent medical care.    History reviewed. No pertinent past medical history.  Past Surgical History  Procedure Date  . Hand surgery   . Hand surgery     Family History  Problem Relation Age of Onset  . Diabetes Mother   . Diabetes Father     History  Substance Use Topics  . Smoking status: Current Everyday Smoker -- 1.0 packs/day    Types: Cigarettes  . Smokeless tobacco: Not on file  . Alcohol Use: No      Review of Systems  Constitutional: Negative for fever and diaphoresis.  Musculoskeletal:       Abscess   All other systems reviewed and are negative.    Allergies  Tramadol and Biaxin  Home Medications   Current Outpatient Rx  Name Route Sig Dispense Refill  . DOXYCYCLINE HYCLATE 100 MG PO CAPS Oral Take 1 capsule (100 mg total) by mouth 2 (two) times daily. For 10 days 20 capsule 0  . DOXYCYCLINE HYCLATE 100 MG PO CAPS Oral Take 1 capsule (100 mg total) by mouth 2 (two) times daily. 20 capsule 0  . HYDROCODONE-ACETAMINOPHEN 5-325 MG PO TABS Oral Take 1 tablet by mouth every 6 (six) hours as needed for pain. 12 tablet 0  .  IBUPROFEN 200 MG PO TABS Oral Take 200 mg by mouth every 6 (six) hours as needed.    . OXYCODONE-ACETAMINOPHEN 5-325 MG PO TABS Oral Take 1 tablet by mouth every 4 (four) hours as needed for pain. 15 tablet 0    BP 128/78  Pulse 73  Temp 98.1 F (36.7 C) (Oral)  Resp 20  Ht 5\' 11"  (1.803 m)  Wt 160 lb (72.576 kg)  BMI 22.32 kg/m2  SpO2 98%  Physical Exam  Nursing note and vitals reviewed. Constitutional: He is oriented to person, place, and time. He appears well-developed and well-nourished.  HENT:  Head: Normocephalic and atraumatic.  Eyes: EOM are normal.  Neck: Normal range of motion.  Cardiovascular: Normal rate, regular rhythm, normal heart sounds and intact distal pulses.   Pulmonary/Chest: Effort normal and breath sounds normal. No respiratory distress.  Abdominal: Soft. He exhibits no distension. There is no tenderness.  Musculoskeletal: Normal range of motion.       Left forearm: He exhibits tenderness and swelling.       Arms:      Area of redness and induration to L dorsal mid forearm.  + PT.  No drainage.  Neurological: He is alert and oriented to person, place, and time.  Skin: Skin is warm and dry.  Psychiatric: He has a normal mood and affect. Judgment normal.  ED Course  Procedures (including critical care time)  Labs Reviewed - No data to display No results found.   1. Abscess       MDM  rx-doxycycline, 20 rx-hydrocodone, 12 Warm compresses. Return to ED prn        Evalina Field, PA 01/22/12 1651  Evalina Field, PA 01/22/12 1654

## 2012-01-23 NOTE — ED Provider Notes (Signed)
Medical screening examination/treatment/procedure(s) were performed by non-physician practitioner and as supervising physician I was immediately available for consultation/collaboration.  Suetta Hoffmeister L Yussuf Sawyers, MD 01/23/12 0935 

## 2012-02-15 ENCOUNTER — Emergency Department (HOSPITAL_COMMUNITY)
Admission: EM | Admit: 2012-02-15 | Discharge: 2012-02-15 | Disposition: A | Discharge status: Home / Self Care | Payer: Medicaid Other | Attending: Emergency Medicine | Admitting: Emergency Medicine

## 2012-02-15 ENCOUNTER — Encounter (HOSPITAL_COMMUNITY): Payer: Self-pay | Admitting: Emergency Medicine

## 2012-02-15 ENCOUNTER — Emergency Department (HOSPITAL_COMMUNITY): Payer: Medicaid Other

## 2012-02-15 DIAGNOSIS — S6000XA Contusion of unspecified finger without damage to nail, initial encounter: Secondary | ICD-10-CM

## 2012-02-15 DIAGNOSIS — S60419A Abrasion of unspecified finger, initial encounter: Secondary | ICD-10-CM

## 2012-02-15 DIAGNOSIS — F17200 Nicotine dependence, unspecified, uncomplicated: Secondary | ICD-10-CM | POA: Insufficient documentation

## 2012-02-15 DIAGNOSIS — IMO0002 Reserved for concepts with insufficient information to code with codable children: Secondary | ICD-10-CM | POA: Insufficient documentation

## 2012-02-15 MED ORDER — OXYCODONE-ACETAMINOPHEN 5-325 MG PO TABS
1.0000 | ORAL_TABLET | Freq: Once | ORAL | Status: AC
  Administered 2012-02-15: 1 via ORAL
  Filled 2012-02-15: qty 1

## 2012-02-15 MED ORDER — IBUPROFEN 800 MG PO TABS
800.0000 mg | ORAL_TABLET | Freq: Once | ORAL | Status: AC
  Administered 2012-02-15: 800 mg via ORAL
  Filled 2012-02-15: qty 1

## 2012-02-15 NOTE — ED Notes (Signed)
Pt c/o pain to right index finger. Pt states he hit his right knuckle with a grinder on Friday and re-injured his finger by shutting it in a car door this am.

## 2012-02-15 NOTE — Discharge Instructions (Signed)
Your x-ray is negative for fracture or dislocation. There no vascular or neurological deficits appreciated. Please cleanse the wound with soap and water and apply a Band-Aid daily until healed.Abrasions Abrasions are skin scrapes. Their treatment depends on how large and deep the abrasion is. Abrasions do not extend through all layers of the skin. A cut or lesion through all skin layers is called a laceration. HOME CARE INSTRUCTIONS   If you were given a dressing, change it at least once a day or as instructed by your caregiver. If the bandage sticks, soak it off with a solution of water or hydrogen peroxide.   Twice a day, wash the area with soap and water to remove all the cream/ointment. You may do this in a sink, under a tub faucet, or in a shower. Rinse off the soap and pat dry with a clean towel. Look for signs of infection (see below).   Reapply cream/ointment according to your caregiver's instruction. This will help prevent infection and keep the bandage from sticking. Telfa or gauze over the wound and under the dressing or wrap will also help keep the bandage from sticking.   If the bandage becomes wet, dirty, or develops a foul smell, change it as soon as possible.   Only take over-the-counter or prescription medicines for pain, discomfort, or fever as directed by your caregiver.  SEEK IMMEDIATE MEDICAL CARE IF:   Increasing pain in the wound.   Signs of infection develop: redness, swelling, surrounding area is tender to touch, or pus coming from the wound.   You have a fever.   Any foul smell coming from the wound or dressing.  Most skin wounds heal within ten days. Facial wounds heal faster. However, an infection may occur despite proper treatment. You should have the wound checked for signs of infection within 24 to 48 hours or sooner if problems arise. If you were not given a wound-check appointment, look closely at the wound yourself on the second day for early signs of infection  listed above. MAKE SURE YOU:   Understand these instructions.   Will watch your condition.   Will get help right away if you are not doing well or get worse.  Document Released: 03/12/2005 Document Revised: 05/22/2011 Document Reviewed: 05/06/2011 [facility] Patient Information 2012 [place], [place].Contusion A contusion is a deep bruise. Contusions are the result of an injury that caused bleeding under the skin. The contusion may turn blue, purple, or yellow. Minor injuries will give you a painless contusion, but more severe contusions may stay painful and swollen for a few weeks.  CAUSES  A contusion is usually caused by a blow, trauma, or direct force to an area of the body. SYMPTOMS   Swelling and redness of the injured area.   Bruising of the injured area.   Tenderness and soreness of the injured area.   Pain.  DIAGNOSIS  The diagnosis can be made by taking a history and physical exam. An X-ray, CT scan, or MRI may be needed to determine if there were any associated injuries, such as fractures. TREATMENT  Specific treatment will depend on what area of the body was injured. In general, the best treatment for a contusion is resting, icing, elevating, and applying cold compresses to the injured area. Over-the-counter medicines may also be recommended for pain control. Ask your caregiver what the best treatment is for your contusion. HOME CARE INSTRUCTIONS   Put ice on the injured area.   Put ice in a plastic  bag.   Place a towel between your skin and the bag.   Leave the ice on for 15 to 20 minutes, 3 to 4 times a day.   Only take over-the-counter or prescription medicines for pain, discomfort, or fever as directed by your caregiver. Your caregiver may recommend avoiding anti-inflammatory medicines (aspirin, ibuprofen, and naproxen) for 48 hours because these medicines may increase bruising.   Rest the injured area.   If possible, elevate the injured area to reduce swelling.    SEEK IMMEDIATE MEDICAL CARE IF:   You have increased bruising or swelling.   You have pain that is getting worse.   Your swelling or pain is not relieved with medicines.  MAKE SURE YOU:   Understand these instructions.   Will watch your condition.   Will get help right away if you are not doing well or get worse.  Document Released: 03/12/2005 Document Revised: 05/22/2011 Document Reviewed: 04/07/2011 [facility] Patient Information 2012 [place], [place].

## 2012-02-15 NOTE — ED Provider Notes (Signed)
History     CSN: [id number]  Arrival date & time 02/15/12  4696   First MD Initiated Contact with Patient 02/15/12 [phone]      Chief Complaint  Patient presents with  . Finger Injury    (Consider location/radiation/quality/duration/timing/severity/associated sxs/prior treatment) Patient is a 36 y.o. male presenting with hand injury. The history is provided by the patient.  Hand Injury  The incident occurred 1 to 2 hours ago. The incident occurred at home. Injury mechanism: Right index finger mashed in car door. The pain is present in the right hand. The quality of the pain is described as aching. The pain is moderate. The pain has been constant since the incident. Pertinent negatives include no fever. He reports no foreign bodies present. The symptoms are aggravated by movement and palpation. He has tried NSAIDs for the symptoms. The treatment provided no relief.    History reviewed. No pertinent past medical history.  Past Surgical History  Procedure Date  . Hand surgery   . Hand surgery     Family History  Problem Relation Age of Onset  . Diabetes Mother   . Diabetes Father     History  Substance Use Topics  . Smoking status: Current Everyday Smoker -- 1.0 packs/day    Types: Cigarettes  . Smokeless tobacco: Not on file  . Alcohol Use: No      Review of Systems  Constitutional: Negative for fever and activity change.       All ROS Neg except as noted in HPI  HENT: Negative for nosebleeds and neck pain.   Eyes: Negative for photophobia and discharge.  Respiratory: Negative for cough, shortness of breath and wheezing.   Cardiovascular: Negative for chest pain and palpitations.  Gastrointestinal: Negative for abdominal pain and blood in stool.  Genitourinary: Negative for dysuria, frequency and hematuria.  Musculoskeletal: Negative for back pain and arthralgias.  Skin: Negative.   Neurological: Negative for dizziness, seizures and speech difficulty.    Psychiatric/Behavioral: Negative for hallucinations and confusion.    Allergies  Tramadol and Biaxin  Home Medications   Current Outpatient Rx  Name Route Sig Dispense Refill  . IBUPROFEN 200 MG PO TABS Oral Take 200 mg by mouth every 6 (six) hours as needed.      BP 125/80  Pulse 81  Temp 98.2 F (36.8 C)  Resp 16  Ht 5\' 11"  (1.803 m)  Wt 165 lb (74.844 kg)  BMI 23.01 kg/m2  SpO2 97%  Physical Exam  Nursing note and vitals reviewed. Constitutional: He is oriented to person, place, and time. He appears well-developed and well-nourished.  Non-toxic appearance.  HENT:  Head: Normocephalic.  Right Ear: Tympanic membrane and external ear normal.  Left Ear: Tympanic membrane and external ear normal.  Eyes: EOM and lids are normal. Pupils are equal, round, and reactive to light.  Neck: Normal range of motion. Neck supple. Carotid bruit is not present.  Cardiovascular: Normal rate, regular rhythm, normal heart sounds, intact distal pulses and normal pulses.   Pulmonary/Chest: Breath sounds normal. No respiratory distress.  Abdominal: Soft. Bowel sounds are normal. There is no tenderness. There is no guarding.  Musculoskeletal: Normal range of motion.       There is an abrasion to the dorsum of the PIP joint of the right index finger. Bleeding controlled. No bone or tendon involvement. Capillary refill is less than 3 seconds. The index finger is sore but no deformity appreciated. Full range of motion of the right wrist  elbow and shoulder.  Lymphadenopathy:       Head (right side): No submandibular adenopathy present.       Head (left side): No submandibular adenopathy present.    He has no cervical adenopathy.  Neurological: He is alert and oriented to person, place, and time. He has normal strength. No cranial nerve deficit or sensory deficit.  Skin: Skin is warm and dry.  Psychiatric: He has a normal mood and affect. His speech is normal.    ED Course  Procedures (including  critical care time)  Labs Reviewed - No data to display No results found.   No diagnosis found.    MDM  I have reviewed nursing notes, vital signs, and all appropriate lab and imaging results for this patient. The patient hit his right index finger against a grinder on August 30. Today he shut his finger in a car door. The x-ray of the right index finger is negative for fracture or dislocation. The patient sustained an abrasion and this was irrigated and a bandage applied. The tetanus is up-to-date per the patient. Patient advised to cleanse the wound daily and use a Band-Aid until wound heals. He is advised to use Tylenol or Motrin for soreness.       Kathie Dike, [place] 02/15/12 [phone]

## 2012-02-16 NOTE — ED Provider Notes (Signed)
Medical screening examination/treatment/procedure(s) were performed by non-physician practitioner and as supervising physician I was immediately available for consultation/collaboration.   Shelda Jakes, MD 02/16/12 [phone]

## 2012-02-26 ENCOUNTER — Emergency Department (HOSPITAL_COMMUNITY)
Admission: EM | Admit: 2012-02-26 | Discharge: 2012-02-26 | Disposition: A | Discharge status: Home / Self Care | Payer: Medicaid Other | Attending: Emergency Medicine | Admitting: Emergency Medicine

## 2012-02-26 ENCOUNTER — Encounter (HOSPITAL_COMMUNITY): Payer: Self-pay | Admitting: Emergency Medicine

## 2012-02-26 DIAGNOSIS — F17200 Nicotine dependence, unspecified, uncomplicated: Secondary | ICD-10-CM | POA: Insufficient documentation

## 2012-02-26 DIAGNOSIS — S93499A Sprain of other ligament of unspecified ankle, initial encounter: Secondary | ICD-10-CM | POA: Insufficient documentation

## 2012-02-26 DIAGNOSIS — X58XXXA Exposure to other specified factors, initial encounter: Secondary | ICD-10-CM | POA: Insufficient documentation

## 2012-02-26 DIAGNOSIS — S86019A Strain of unspecified Achilles tendon, initial encounter: Secondary | ICD-10-CM

## 2012-02-26 MED ORDER — OXYCODONE-ACETAMINOPHEN 5-325 MG PO TABS
1.0000 | ORAL_TABLET | Freq: Once | ORAL | Status: AC
  Administered 2012-02-26: 1 via ORAL
  Filled 2012-02-26: qty 1

## 2012-02-26 MED ORDER — NAPROXEN 500 MG PO TABS: 500.0000 mg | ORAL_TABLET | Freq: Two times a day (BID) | ORAL | Status: DC | PRN

## 2012-02-26 MED ORDER — IBUPROFEN 400 MG PO TABS
400.0000 mg | ORAL_TABLET | Freq: Once | ORAL | Status: AC
  Administered 2012-02-26: 400 mg via ORAL
  Filled 2012-02-26: qty 1

## 2012-02-26 NOTE — ED Notes (Signed)
Patient requested an ace bandage to place on foot. Wrapped foot with ace bandage as requested. Patient states "that feels better."

## 2012-02-26 NOTE — ED Notes (Signed)
Patient states he hurt his "achilles heel" on his right foot 2 days ago. States he has previous injury in same area.

## 2012-03-02 NOTE — ED Provider Notes (Signed)
History    36yM with R foot pain . Onset 2d ago. Pain is in heel area. Says sharp pain when pushing off with foot. Persistent pain since. No pain anywhere else. No numbness or tingling. Reports hx of previous achilles injury to same side.  CSN: [id number]  Arrival date & time 02/26/12  0046   First MD Initiated Contact with Patient 02/26/12 0052      Chief Complaint  Patient presents with  . Foot Pain    (Consider location/radiation/quality/duration/timing/severity/associated sxs/prior treatment) Patient is a 36 y.o. male presenting with lower extremity pain.  Foot Pain    History reviewed. No pertinent past medical history.  Past Surgical History  Procedure Date  . Hand surgery   . Hand surgery     Family History  Problem Relation Age of Onset  . Diabetes Mother   . Diabetes Father     History  Substance Use Topics  . Smoking status: Current Every Day Smoker -- 1.0 packs/day    Types: Cigarettes  . Smokeless tobacco: Not on file  . Alcohol Use: Yes     rarely      Review of Systems   Review of symptoms negative unless otherwise noted in HPI.   Allergies  Tramadol and Biaxin  Home Medications   Current Outpatient Rx  Name Route Sig Dispense Refill  . NAPROXEN 500 MG PO TABS Oral Take 1 tablet (500 mg total) by mouth 2 (two) times daily as needed. 20 tablet 0    BP 126/76  Pulse 74  Temp 98.1 F (36.7 C) (Oral)  Resp 16  Ht 5\' 11"  (1.803 m)  Wt 165 lb (74.844 kg)  BMI 23.01 kg/m2  SpO2 96%  Physical Exam  Nursing note and vitals reviewed. Constitutional: He appears well-developed and well-nourished. No distress.  HENT:  Head: Normocephalic and atraumatic.  Eyes: Conjunctivae normal are normal. Right eye exhibits no discharge. Left eye exhibits no discharge.  Neck: Neck supple.  Cardiovascular: Normal rate, regular rhythm and normal heart sounds.  Exam reveals no gallop and no friction rub.   No murmur heard. Pulmonary/Chest: Effort normal  and breath sounds normal. No respiratory distress.  Abdominal: Soft. He exhibits no distension. There is no tenderness.  Musculoskeletal: He exhibits no edema and no tenderness.       Tenderness in area of R achilles. No overlying skin changes. No swelling. Neg thompson's. Plantar/dorsiflexion intact against resistance. NVI distally.  Neurological: He is alert.  Skin: Skin is warm and dry.  Psychiatric: He has a normal mood and affect. His behavior is normal. Thought content normal.    ED Course  Procedures (including critical care time)  Labs Reviewed - No data to display No results found.   1. Strain of Achilles tendon       MDM  36yM with R heel pain. Likely strain or potentially partial tear of achilles. Plan prn pain meds. Gentle stretching. Return precautions discussed. outpt fu as needed.        Raeford Razor, MD 03/02/12 [phone]

## 2012-03-24 ENCOUNTER — Encounter (HOSPITAL_COMMUNITY): Payer: Self-pay | Admitting: Emergency Medicine

## 2012-03-24 ENCOUNTER — Emergency Department (HOSPITAL_COMMUNITY)
Admission: EM | Admit: 2012-03-24 | Discharge: 2012-03-24 | Disposition: A | Discharge status: Home / Self Care | Payer: Medicaid Other | Attending: Emergency Medicine | Admitting: Emergency Medicine

## 2012-03-24 DIAGNOSIS — K0889 Other specified disorders of teeth and supporting structures: Secondary | ICD-10-CM

## 2012-03-24 DIAGNOSIS — F17200 Nicotine dependence, unspecified, uncomplicated: Secondary | ICD-10-CM | POA: Insufficient documentation

## 2012-03-24 DIAGNOSIS — K089 Disorder of teeth and supporting structures, unspecified: Secondary | ICD-10-CM | POA: Insufficient documentation

## 2012-03-24 DIAGNOSIS — Z888 Allergy status to other drugs, medicaments and biological substances status: Secondary | ICD-10-CM | POA: Insufficient documentation

## 2012-03-24 MED ORDER — HYDROCODONE-ACETAMINOPHEN 5-325 MG PO TABS: ORAL_TABLET | ORAL | Status: DC

## 2012-03-24 MED ORDER — OXYCODONE-ACETAMINOPHEN 5-325 MG PO TABS
1.0000 | ORAL_TABLET | Freq: Once | ORAL | Status: AC
  Administered 2012-03-24: 1 via ORAL
  Filled 2012-03-24: qty 1

## 2012-03-24 MED ORDER — PENICILLIN V POTASSIUM 500 MG PO TABS: 500.0000 mg | ORAL_TABLET | Freq: Four times a day (QID) | ORAL | Status: AC

## 2012-03-24 MED ORDER — PENICILLIN V POTASSIUM 250 MG PO TABS
500.0000 mg | ORAL_TABLET | Freq: Once | ORAL | Status: AC
  Administered 2012-03-24: 500 mg via ORAL
  Filled 2012-03-24: qty 2

## 2012-03-24 NOTE — ED Notes (Signed)
Patient c/o left sided tooth pain with facial swelling.  Patient states has "knot" on gums.

## 2012-03-25 NOTE — ED Provider Notes (Signed)
History     CSN: [id number]  Arrival date & time 03/24/12  2144   First MD Initiated Contact with Patient 03/24/12 2152      Chief Complaint  Patient presents with  . Dental Pain    (Consider location/radiation/quality/duration/timing/severity/associated sxs/prior treatment) Patient is a 36 y.o. male presenting with tooth pain. The history is provided by the patient.  Dental PainThe primary symptoms include mouth pain. Primary symptoms do not include headaches, fever, shortness of breath, sore throat, angioedema or cough. The symptoms began 3 to 5 days ago. The symptoms are unchanged. The symptoms are new. The symptoms occur constantly.  Affected locations include: teeth and gum(s).  Additional symptoms include: dental sensitivity to temperature, gum swelling and gum tenderness. Additional symptoms do not include: purulent gums, trismus, jaw pain, facial swelling, trouble swallowing, pain with swallowing, ear pain and swollen glands. Medical issues include: smoking and periodontal disease.    History reviewed. No pertinent past medical history.  Past Surgical History  Procedure Date  . Hand surgery   . Hand surgery     Family History  Problem Relation Age of Onset  . Diabetes Mother   . Diabetes Father     History  Substance Use Topics  . Smoking status: Current Every Day Smoker -- 1.0 packs/day    Types: Cigarettes  . Smokeless tobacco: Not on file  . Alcohol Use: Yes     rarely      Review of Systems  Constitutional: Negative for fever and appetite change.  HENT: Positive for dental problem. Negative for ear pain, congestion, sore throat, facial swelling, trouble swallowing, neck pain and neck stiffness.   Eyes: Negative for pain and visual disturbance.  Respiratory: Negative for cough and shortness of breath.   Neurological: Negative for dizziness, facial asymmetry and headaches.  Hematological: Negative for adenopathy.  All other systems reviewed and are  negative.    Allergies  Tramadol and Biaxin  Home Medications   Current Outpatient Rx  Name Route Sig Dispense Refill  . IBUPROFEN 200 MG PO TABS Oral Take 200-600 mg by mouth daily as needed. For pain    . HYDROCODONE-ACETAMINOPHEN 5-325 MG PO TABS  Take one-two tabs po q 4-6 hrs prn pain 20 tablet 0  . PENICILLIN V POTASSIUM 500 MG PO TABS Oral Take 1 tablet (500 mg total) by mouth 4 (four) times daily. 40 tablet 0    BP 118/79  Pulse 90  Temp 98.7 F (37.1 C) (Oral)  Resp 18  Ht 5\' 11"  (1.803 m)  Wt 165 lb (74.844 kg)  BMI 23.01 kg/m2  SpO2 100%  Physical Exam  Nursing note and vitals reviewed. Constitutional: He is oriented to person, place, and time. He appears well-developed and well-nourished. No distress.  HENT:  Head: Normocephalic and atraumatic. No trismus in the jaw.  Right Ear: Tympanic membrane and ear canal normal.  Left Ear: Tympanic membrane and ear canal normal.  Mouth/Throat: Uvula is midline, oropharynx is clear and moist and mucous membranes are normal. Dental caries present. No dental abscesses or uvula swelling.    Neck: Normal range of motion. Neck supple.  Cardiovascular: Normal rate, regular rhythm and normal heart sounds.   No murmur heard. Pulmonary/Chest: Effort normal and breath sounds normal.  Musculoskeletal: Normal range of motion.  Lymphadenopathy:    He has no cervical adenopathy.  Neurological: He is alert and oriented to person, place, and time. He exhibits normal muscle tone. Coordination normal.  Skin: Skin is warm  and dry.    ED Course  Procedures (including critical care time)  Labs Reviewed - No data to display No results found.   1. Pain, dental       MDM    ttp of the left upper gums and teeth.  Multiple dental caries.  No obvious dental abscess.  Pt advised that he will need to f/u with a dentist, referral list given  Prescribed: norco #20 Pen VK        Emmilee Reamer L. [place], [place] 03/25/12 2020

## 2012-03-26 NOTE — ED Provider Notes (Signed)
Medical screening examination/treatment/procedure(s) were performed by non-physician practitioner and as supervising physician I was immediately available for consultation/collaboration.   Shamonique Battiste W Ousman Dise, MD 03/26/12 0102 

## 2012-09-20 IMAGING — CR DG ANKLE COMPLETE 3+V*R*
3 series · 3 of 3 positions shown · non-contrast
Comparison: None.

CLINICAL DATA: Right ankle pain post fall

RIGHT ANKLE - COMPLETE 3+ VIEW

[view not recorded (1 of 3)]
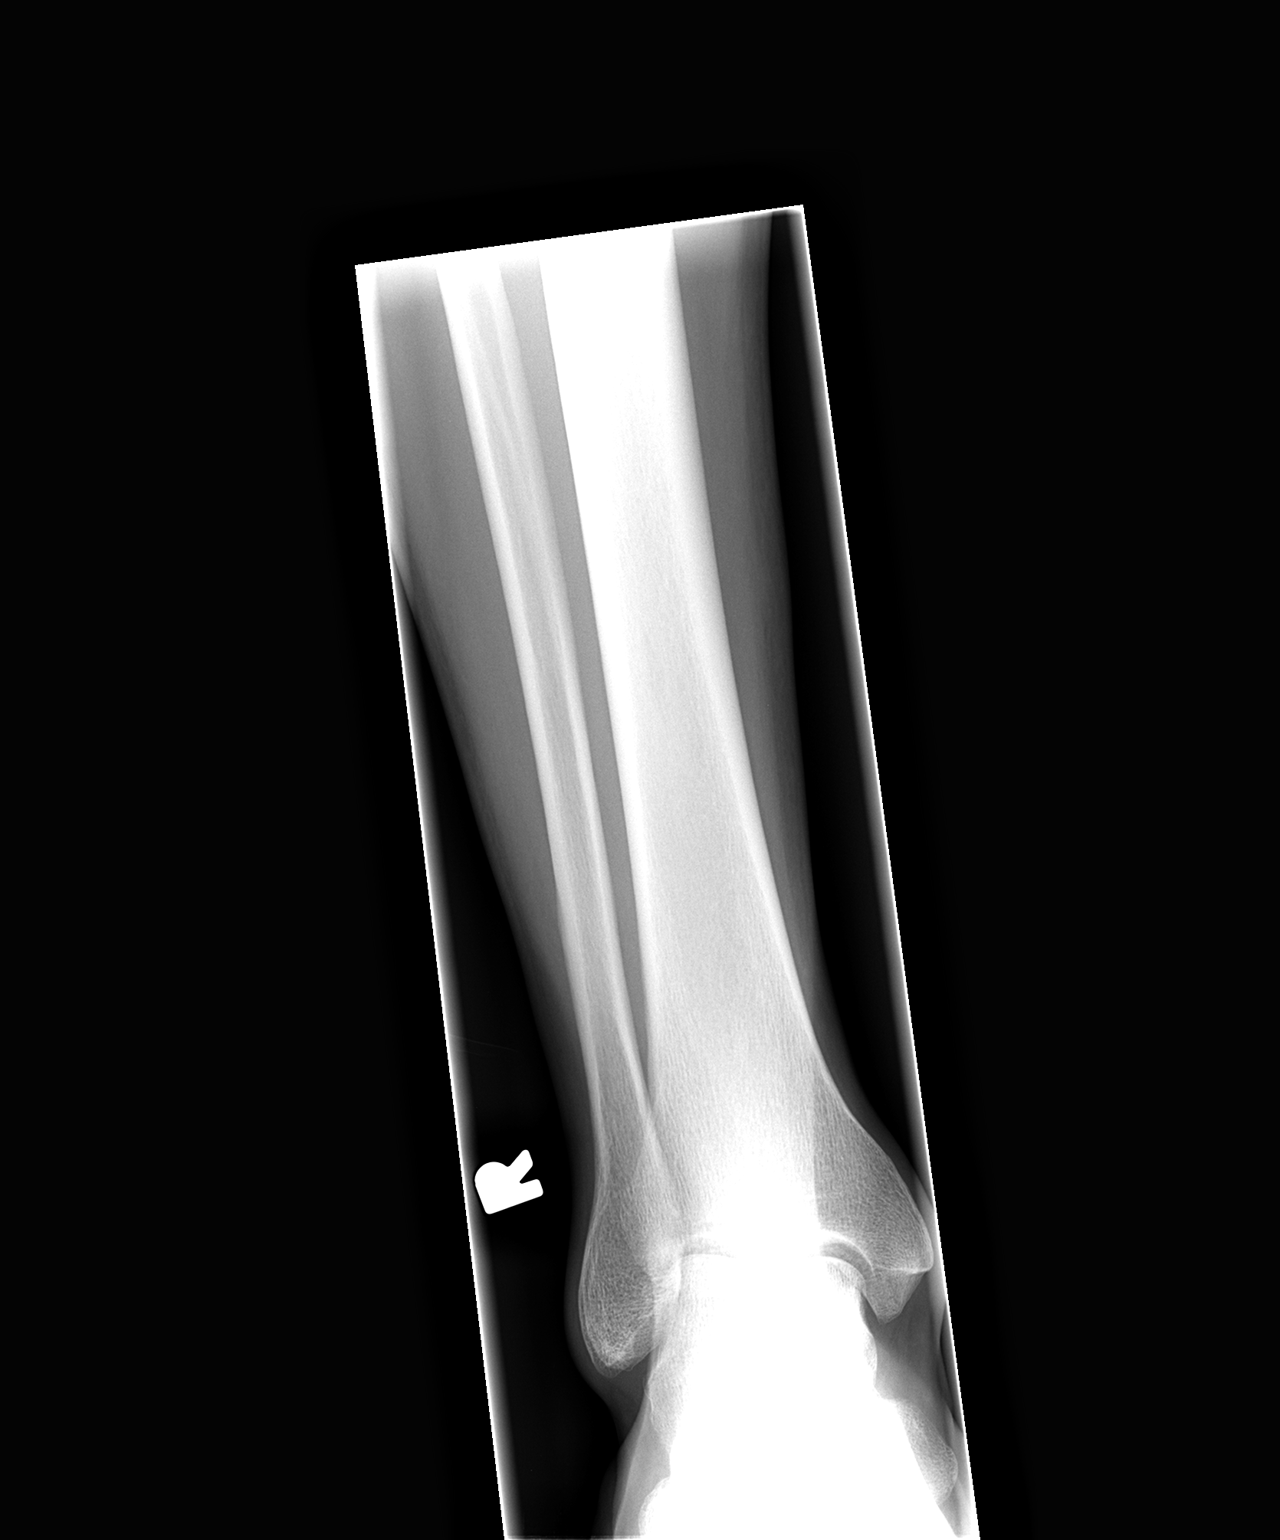

[view not recorded (2 of 3)]
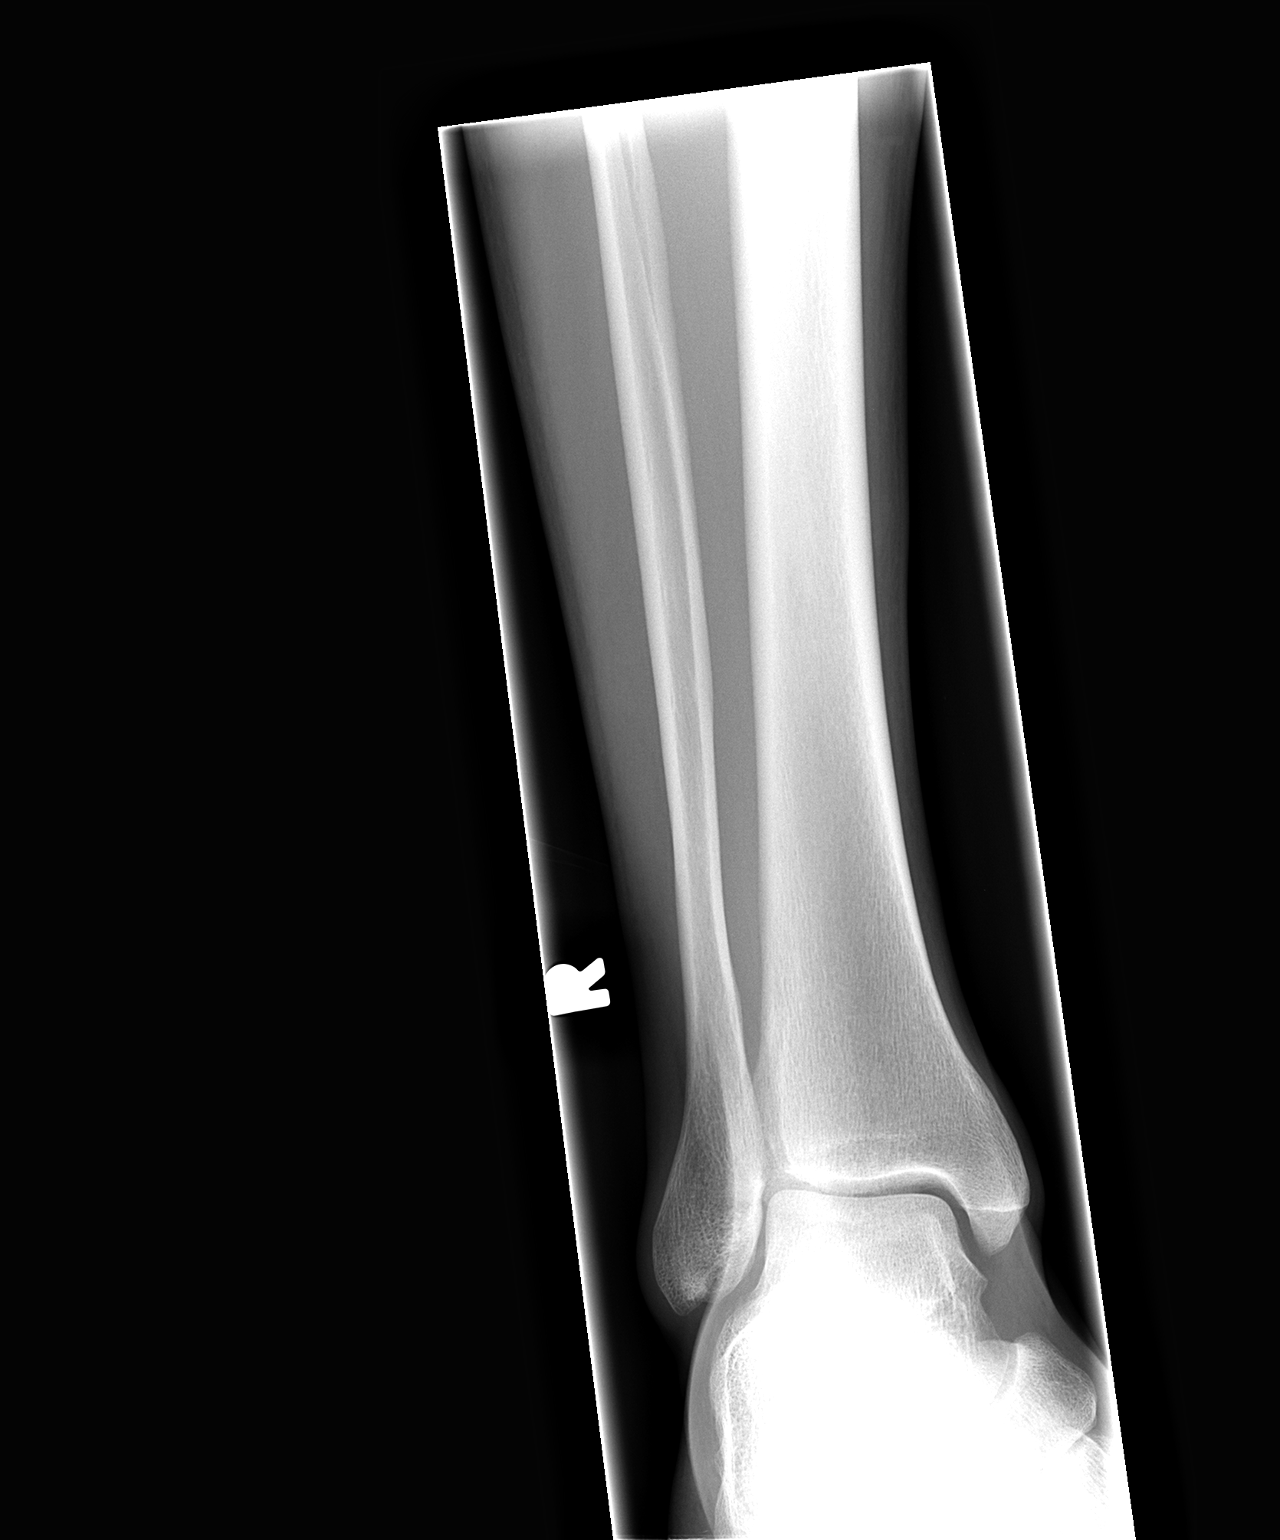

[view not recorded (3 of 3)]
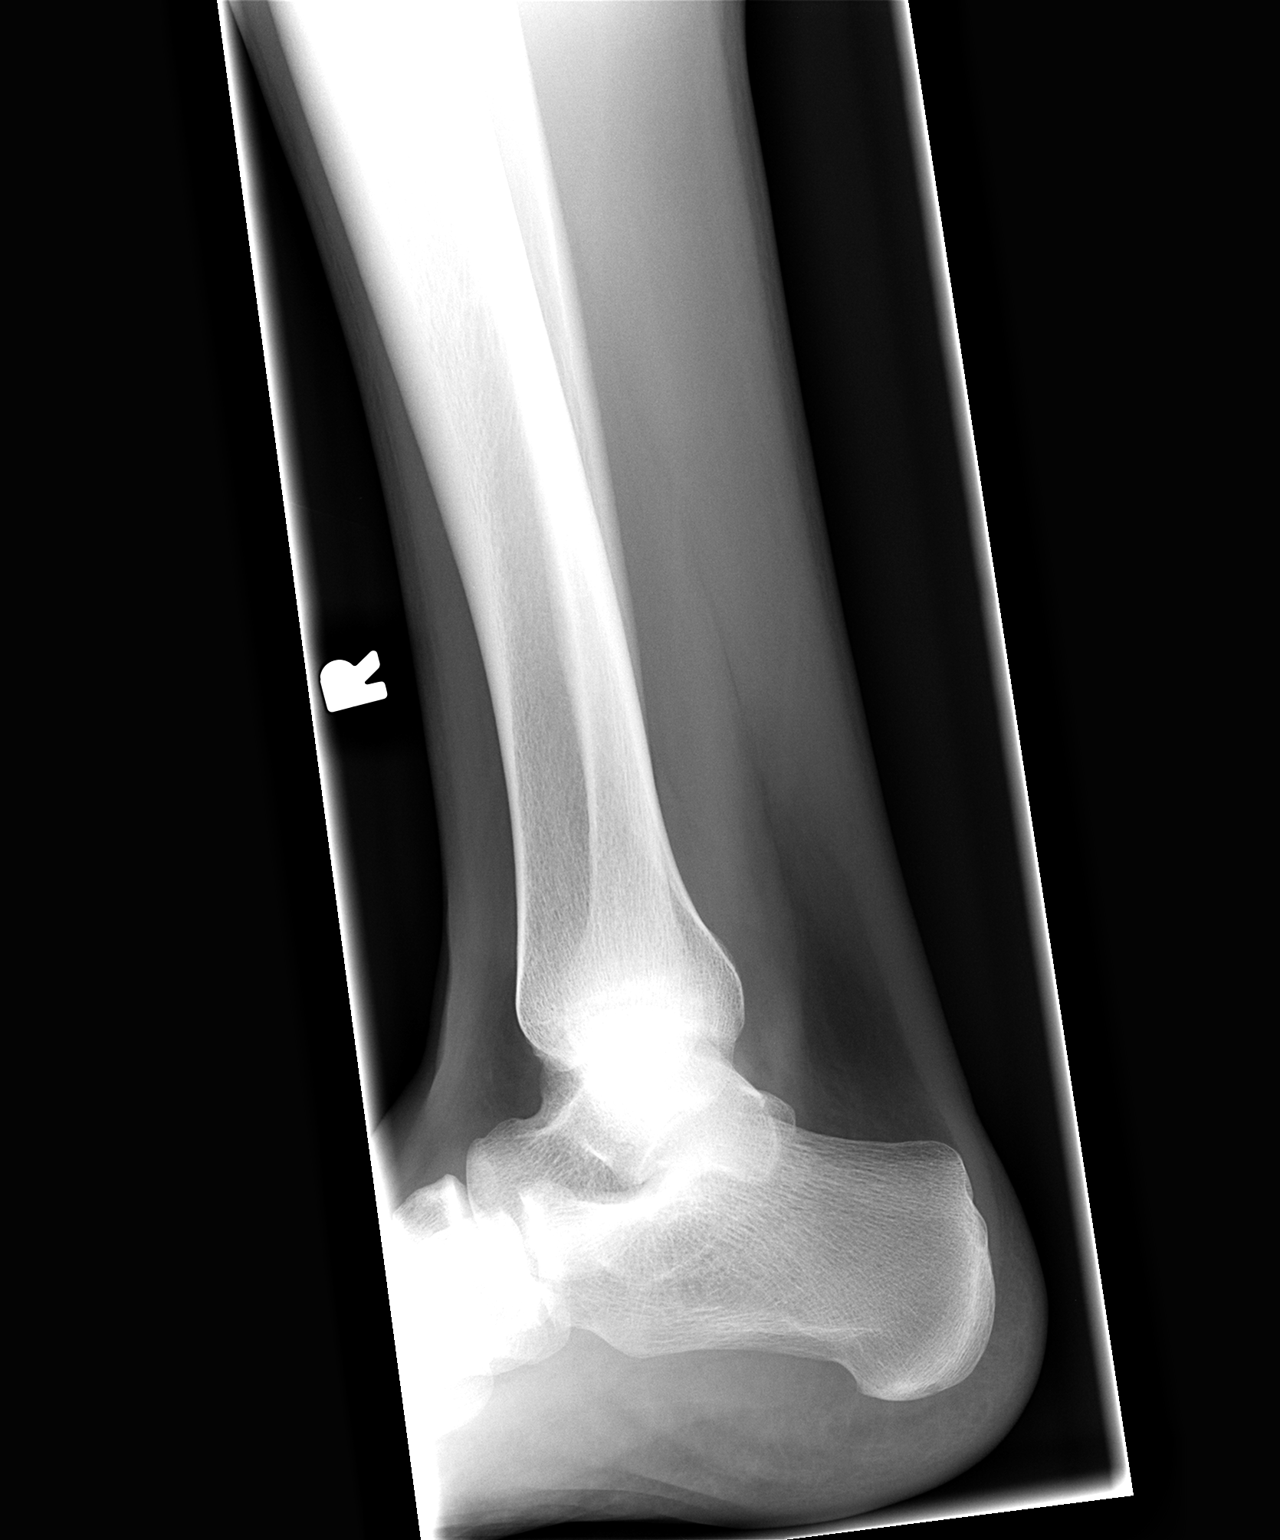

[3 of 3 positions shown; findings below may reference images not displayed]

FINDINGS: No fracture or dislocation.  Regional soft tissues are
normal.  Ankle mortise is preserved.  Joint spaces are preserved.
No definite ankle joint effusion.
IMPRESSION: No fracture.

## 2013-03-16 ENCOUNTER — Encounter (HOSPITAL_COMMUNITY): Payer: Self-pay

## 2013-03-16 ENCOUNTER — Emergency Department (HOSPITAL_COMMUNITY)
Admission: EM | Admit: 2013-03-16 | Discharge: 2013-03-16 | Disposition: A | Discharge status: Home / Self Care | Payer: Medicaid Other | Attending: Emergency Medicine | Admitting: Emergency Medicine

## 2013-03-16 ENCOUNTER — Emergency Department (HOSPITAL_COMMUNITY): Payer: Medicaid Other

## 2013-03-16 DIAGNOSIS — Z87448 Personal history of other diseases of urinary system: Secondary | ICD-10-CM | POA: Insufficient documentation

## 2013-03-16 DIAGNOSIS — R319 Hematuria, unspecified: Secondary | ICD-10-CM | POA: Insufficient documentation

## 2013-03-16 DIAGNOSIS — R109 Unspecified abdominal pain: Secondary | ICD-10-CM | POA: Insufficient documentation

## 2013-03-16 DIAGNOSIS — F17200 Nicotine dependence, unspecified, uncomplicated: Secondary | ICD-10-CM | POA: Insufficient documentation

## 2013-03-16 HISTORY — DX: Disorder of kidney and ureter, unspecified: N28.9

## 2013-03-16 LAB — CBC WITH DIFFERENTIAL/PLATELET
Basophils Absolute: 0 10*3/uL (ref 0.0–0.1)
Basophils Relative: 1 % (ref 0–1)
Eosinophils Absolute: 0.1 10*3/uL (ref 0.0–0.7)
Eosinophils Relative: 2 % (ref 0–5)
HCT: 43 % (ref 39.0–52.0)
Hemoglobin: 15.2 g/dL (ref 13.0–17.0)
Lymphocytes Relative: 32 % (ref 12–46)
Lymphs Abs: 2.3 10*3/uL (ref 0.7–4.0)
MCH: 32.8 pg (ref 26.0–34.0)
MCHC: 35.3 g/dL (ref 30.0–36.0)
MCV: 92.9 fL (ref 78.0–100.0)
Monocytes Absolute: 0.6 10*3/uL (ref 0.1–1.0)
Monocytes Relative: 8 % (ref 3–12)
Neutro Abs: 4.2 10*3/uL (ref 1.7–7.7)
Neutrophils Relative %: 58 % (ref 43–77)
Platelets: 242 10*3/uL (ref 150–400)
RBC: 4.63 MIL/uL (ref 4.22–5.81)
RDW: 13.2 % (ref 11.5–15.5)
WBC: 7.2 10*3/uL (ref 4.0–10.5)

## 2013-03-16 LAB — BASIC METABOLIC PANEL
BUN: 12 mg/dL (ref 6–23)
CO2: 28 mEq/L (ref 19–32)
Calcium: 9.2 mg/dL (ref 8.4–10.5)
Chloride: 104 mEq/L (ref 96–112)
Creatinine, Ser: 0.94 mg/dL (ref 0.50–1.35)
GFR calc Af Amer: >90 mL/min (ref >90)
GFR calc non Af Amer: >90 mL/min (ref >90)
Glucose, Bld: 83 mg/dL (ref 70–99)
Potassium: 4.1 mEq/L (ref 3.5–5.1)
Sodium: 139 mEq/L (ref 135–145)

## 2013-03-16 LAB — URINE MICROSCOPIC-ADD ON

## 2013-03-16 LAB — URINALYSIS, ROUTINE W REFLEX MICROSCOPIC
Bilirubin Urine: NEGATIVE
Glucose, UA: NEGATIVE mg/dL
Ketones, ur: NEGATIVE mg/dL
Leukocytes, UA: NEGATIVE
Nitrite: NEGATIVE
Protein, ur: NEGATIVE mg/dL
Specific Gravity, Urine: <1.005 — ABNORMAL LOW (ref 1.005–1.030)
Urobilinogen, UA: 0.2 mg/dL (ref 0.0–1.0)
pH: 6 (ref 5.0–8.0)

## 2013-03-16 MED ORDER — HYDROMORPHONE HCL PF 1 MG/ML IJ SOLN
1.0000 mg | Freq: Once | INTRAMUSCULAR | Status: AC
  Administered 2013-03-16: 1 mg via INTRAVENOUS
  Filled 2013-03-16: qty 1

## 2013-03-16 MED ORDER — SODIUM CHLORIDE 0.9 % IV BOLUS (SEPSIS)
1000.0000 mL | Freq: Once | INTRAVENOUS | Status: AC
  Administered 2013-03-16: 1000 mL via INTRAVENOUS

## 2013-03-16 NOTE — ED Provider Notes (Signed)
CSN: [id number]     Arrival date & time 03/16/13  1532 History   First MD Initiated Contact with Patient 03/16/13 1628     Chief Complaint  Patient presents with  . Flank Pain   (Consider location/radiation/quality/duration/timing/severity/associated sxs/prior Treatment) HPI Comments: 37 yo male with sharp, constant, left flank pain for 2 days, gradually worsening.    Patient is a 37 y.o. male presenting with flank pain.  Flank Pain This is a new problem. The current episode started 2 days ago. The problem occurs constantly. The problem has been gradually worsening. Pertinent negatives include no chest pain, no abdominal pain and no shortness of breath. Associated symptoms comments: Blood in urine, no fevers. Nothing aggravates the symptoms. Relieved by: urination     Past Medical History  Diagnosis Date  . Renal disorder    Past Surgical History  Procedure Laterality Date  . Hand surgery    . Hand surgery     Family History  Problem Relation Age of Onset  . Diabetes Mother   . Diabetes Father    History  Substance Use Topics  . Smoking status: Current Every Day Smoker -- 1.00 packs/day    Types: Cigarettes  . Smokeless tobacco: Not on file  . Alcohol Use: Yes     Comment: rarely    Review of Systems  Constitutional: Negative for fever.  HENT: Negative for congestion.   Respiratory: Negative for cough and shortness of breath.   Cardiovascular: Negative for chest pain.  Gastrointestinal: Negative for nausea, vomiting, abdominal pain and diarrhea.  Genitourinary: Positive for flank pain.  All other systems reviewed and are negative.    Allergies  Tramadol and Biaxin  Home Medications  No current outpatient prescriptions on file. BP 116/81  Pulse 77  Temp(Src) 98 F (36.7 C)  Resp 22  Ht 5\' 11"  (1.803 m)  Wt 165 lb (74.844 kg)  BMI 23.02 kg/m2  SpO2 100% Physical Exam  Nursing note and vitals reviewed. Constitutional: He is oriented to person, place, and  time. He appears well-developed and well-nourished. No distress.  HENT:  Head: Normocephalic and atraumatic.  Mouth/Throat: Oropharynx is clear and moist.  Eyes: Conjunctivae are normal. Pupils are equal, round, and reactive to light. No scleral icterus.  Neck: Neck supple.  Cardiovascular: Normal rate, regular rhythm, normal heart sounds and intact distal pulses.   No murmur heard. Pulmonary/Chest: Effort normal and breath sounds normal. No stridor. No respiratory distress. He has no wheezes. He has no rales.  Abdominal: Soft. He exhibits no distension. There is no tenderness. There is no rebound and no guarding.  No flank tenderness  Musculoskeletal: Normal range of motion. He exhibits no edema.       Lumbar back: He exhibits no bony tenderness.  Neurological: He is alert and oriented to person, place, and time.  Skin: Skin is warm and dry. No rash noted.  Psychiatric: He has a normal mood and affect. His behavior is normal.    ED Course  Procedures (including critical care time) Labs Review Labs Reviewed  URINALYSIS, ROUTINE W REFLEX MICROSCOPIC - Abnormal; Notable for the following:    Specific Gravity, Urine <1.005 (*)    Hgb urine dipstick LARGE (*)    All other components within normal limits  URINE MICROSCOPIC-ADD ON - Abnormal; Notable for the following:    Squamous Epithelial / LPF FEW (*)    All other components within normal limits  CBC WITH DIFFERENTIAL  BASIC METABOLIC PANEL   Imaging  Review Ct Abdomen Pelvis Wo Contrast  03/16/2013   CLINICAL DATA:  37 year old male with left flank, abdominal and pelvic pain. History of urinary calculi.  EXAM: CT ABDOMEN AND PELVIS WITHOUT CONTRAST  TECHNIQUE: Multidetector CT imaging of the abdomen and pelvis was performed following the standard protocol without intravenous contrast.  COMPARISON:  12/03/2006  FINDINGS: The lung bases are clear.  The kidneys bilaterally are unremarkable. There is no evidence of hydronephrosis or  urinary calculi.  The liver, spleen, adrenal glands, gallbladder and pancreas are unremarkable.  Please note that parenchymal abnormalities may be missed without intravenous contrast.  There is no evidence of free fluid, enlarged lymph nodes, biliary dilation or abdominal aortic aneurysm.  The bowel, bladder and appendix are unremarkable. There is no evidence of bowel obstruction, abscess or pneumoperitoneum.  No acute or suspicious bony abnormalities are identified.  IMPRESSION: No acute or significant abnormalities.  No evidence of urinary calculi.   Electronically Signed   By: Laveda Abbe M.D.   On: 03/16/2013 19:31  All radiology studies independently viewed by me.      MDM   1. Left flank pain    36 yo male with 2 days of left flank pain, then hematuria today.  Well appearing, no distress.  Abdomen and flank are nontender.  Plan to check labs and give IV dilaudid for pain.    UA showed hematuria.  CT scan obtained and showed no kidney stone.  Blood possibly from passed stone.  He states sharp pain has gone, now dull ache.  He has remained well appearing. Will discharge home.  Candyce Churn, MD 03/16/13 2056

## 2013-03-16 NOTE — ED Notes (Signed)
Pt reports left flank pain for 2 days, pain is worse today. Has noted blood in his urine today. Denies any nausea or  Vomiting,no fever. H/o stones

## 2013-03-16 NOTE — ED Notes (Signed)
Dr Loretha Stapler at bedside discussing plan of care and Dc instructions with said pt

## 2015-01-04 ENCOUNTER — Emergency Department (HOSPITAL_COMMUNITY): Payer: Medicaid Other

## 2015-01-04 ENCOUNTER — Encounter (HOSPITAL_COMMUNITY): Payer: Self-pay | Admitting: *Deleted

## 2015-01-04 ENCOUNTER — Emergency Department (HOSPITAL_COMMUNITY)
Admission: EM | Admit: 2015-01-04 | Discharge: 2015-01-05 | Disposition: A | Discharge status: Home / Self Care | Payer: Medicaid Other | Attending: Emergency Medicine | Admitting: Emergency Medicine

## 2015-01-04 DIAGNOSIS — R590 Localized enlarged lymph nodes: Secondary | ICD-10-CM | POA: Insufficient documentation

## 2015-01-04 DIAGNOSIS — Z87448 Personal history of other diseases of urinary system: Secondary | ICD-10-CM | POA: Diagnosis not present

## 2015-01-04 DIAGNOSIS — R1013 Epigastric pain: Secondary | ICD-10-CM | POA: Diagnosis not present

## 2015-01-04 DIAGNOSIS — Z720 Tobacco use: Secondary | ICD-10-CM | POA: Insufficient documentation

## 2015-01-04 DIAGNOSIS — R509 Fever, unspecified: Secondary | ICD-10-CM | POA: Diagnosis present

## 2015-01-04 DIAGNOSIS — R109 Unspecified abdominal pain: Secondary | ICD-10-CM

## 2015-01-04 LAB — CBC WITH DIFFERENTIAL/PLATELET
Basophils Absolute: 0 10*3/uL (ref 0.0–0.1)
Basophils Relative: 0 % (ref 0–1)
Eosinophils Absolute: 0.1 10*3/uL (ref 0.0–0.7)
Eosinophils Relative: 1 % (ref 0–5)
HCT: 39.7 % (ref 39.0–52.0)
Hemoglobin: 13.9 g/dL (ref 13.0–17.0)
Lymphocytes Relative: 22 % (ref 12–46)
Lymphs Abs: 2.3 10*3/uL (ref 0.7–4.0)
MCH: 31.4 pg (ref 26.0–34.0)
MCHC: 35 g/dL (ref 30.0–36.0)
MCV: 89.8 fL (ref 78.0–100.0)
Monocytes Absolute: 2 10*3/uL — ABNORMAL HIGH (ref 0.1–1.0)
Monocytes Relative: 19 % — ABNORMAL HIGH (ref 3–12)
Neutro Abs: 6.1 10*3/uL (ref 1.7–7.7)
Neutrophils Relative %: 58 % (ref 43–77)
Platelets: 190 10*3/uL (ref 150–400)
RBC: 4.42 MIL/uL (ref 4.22–5.81)
RDW: 12.8 % (ref 11.5–15.5)
WBC: 10.5 10*3/uL (ref 4.0–10.5)

## 2015-01-04 LAB — COMPREHENSIVE METABOLIC PANEL
ALT: 38 U/L (ref 17–63)
AST: 26 U/L (ref 15–41)
Albumin: 3.6 g/dL (ref 3.5–5.0)
Alkaline Phosphatase: 84 U/L (ref 38–126)
Anion gap: 9 (ref 5–15)
BUN: 14 mg/dL (ref 6–20)
CO2: 29 mmol/L (ref 22–32)
Calcium: 9.2 mg/dL (ref 8.9–10.3)
Chloride: 95 mmol/L — ABNORMAL LOW (ref 101–111)
Creatinine, Ser: 0.96 mg/dL (ref 0.61–1.24)
GFR calc Af Amer: >60 mL/min (ref >60)
GFR calc non Af Amer: >60 mL/min (ref >60)
Glucose, Bld: 109 mg/dL — ABNORMAL HIGH (ref 65–99)
Potassium: 4.4 mmol/L (ref 3.5–5.1)
Sodium: 133 mmol/L — ABNORMAL LOW (ref 135–145)
Total Bilirubin: 0.8 mg/dL (ref 0.3–1.2)
Total Protein: 8.5 g/dL — ABNORMAL HIGH (ref 6.5–8.1)

## 2015-01-04 LAB — URINALYSIS, ROUTINE W REFLEX MICROSCOPIC
Glucose, UA: NEGATIVE mg/dL
Hgb urine dipstick: NEGATIVE
Ketones, ur: NEGATIVE mg/dL
Leukocytes, UA: NEGATIVE
Nitrite: NEGATIVE
Protein, ur: NEGATIVE mg/dL
Specific Gravity, Urine: 1.02 (ref 1.005–1.030)
Urobilinogen, UA: 2 mg/dL — ABNORMAL HIGH (ref 0.0–1.0)
pH: 6 (ref 5.0–8.0)

## 2015-01-04 LAB — LIPASE, BLOOD: Lipase: 16 U/L — ABNORMAL LOW (ref 22–51)

## 2015-01-04 MED ORDER — ACETAMINOPHEN 325 MG PO TABS
650.0000 mg | ORAL_TABLET | Freq: Once | ORAL | Status: AC
  Administered 2015-01-04: 650 mg via ORAL

## 2015-01-04 MED ORDER — IOHEXOL 300 MG/ML  SOLN
25.0000 mL | Freq: Once | INTRAMUSCULAR | Status: AC | PRN
  Administered 2015-01-04: 25 mL via ORAL

## 2015-01-04 MED ORDER — HYDROMORPHONE HCL 1 MG/ML IJ SOLN
1.0000 mg | Freq: Once | INTRAMUSCULAR | Status: AC
  Administered 2015-01-04: 1 mg via INTRAVENOUS
  Filled 2015-01-04: qty 1

## 2015-01-04 MED ORDER — AMOXICILLIN-POT CLAVULANATE 875-125 MG PO TABS: 1.0000 | ORAL_TABLET | Freq: Two times a day (BID) | ORAL | Status: DC

## 2015-01-04 MED ORDER — ACETAMINOPHEN 325 MG PO TABS
ORAL_TABLET | ORAL | Status: AC
  Filled 2015-01-04: qty 2

## 2015-01-04 MED ORDER — OXYCODONE-ACETAMINOPHEN 5-325 MG PO TABS: 1.0000 | ORAL_TABLET | ORAL | Status: DC | PRN

## 2015-01-04 MED ORDER — IOHEXOL 300 MG/ML  SOLN
100.0000 mL | Freq: Once | INTRAMUSCULAR | Status: AC | PRN
  Administered 2015-01-04: 100 mL via INTRAVENOUS

## 2015-01-04 NOTE — Discharge Instructions (Signed)
Please obtain all of your results from medical records or have your doctors office obtain the results - share them with your doctor - you should be seen at your doctors office in the next 2 days. Call today to arrange your follow up. Take the medications as prescribed. Please review all of the medicines and only take them if you do not have an allergy to them. Please be aware that if you are taking birth control pills, taking other prescriptions, ESPECIALLY ANTIBIOTICS may make the birth control ineffective - if this is the case, either do not engage in sexual activity or use alternative methods of birth control such as condoms until you have finished the medicine and your family doctor says it is OK to restart them. If you are on a blood thinner such as COUMADIN, be aware that any other medicine that you take may cause the coumadin to either work too much, or not enough - you should have your coumadin level rechecked in next 7 days if this is the case.  °?  °It is also a possibility that you have an allergic reaction to any of the medicines that you have been prescribed - Everybody reacts differently to medications and while MOST people have no trouble with most medicines, you may have a reaction such as nausea, vomiting, rash, swelling, shortness of breath. If this is the case, please stop taking the medicine immediately and contact your physician.  °?  °You should return to the ER if you develop severe or worsening symptoms.  ° °[facility] Doctor List ° ° ° °Edward Hawkins MD. Specialty: Pulmonary Disease Contact information: [address]  °[address]  °[place] [address]  °[phone]  ° °Margaret Simpson, MD. Specialty: Family Medicine Contact information: [address]  °[place] [address]  °[phone]  ° °Scott Luking, MD. Specialty: Family Medicine Contact information: [address]  °[address]  °[place] [address]  °[phone]  ° °Tesfaye Fanta, MD Specialty:  Internal Medicine Contact information: [address]  °[place] [address]  °[phone]  ° °Zach Hall, MD. Specialty: Internal Medicine Contact information: [address]  °[place] [address]  °[phone]  ° °Angus Mcinnis, MD. Specialty: Family Medicine Contact information: [address]  °[place] [address]  °[phone]  ° °Stephen Knowlton, MD. Specialty: Family Medicine Contact information: [address]  °[address]  °[place] [address]  °[phone]  ° °Roy Fagan, MD. Specialty: Internal Medicine Contact information: [address]  °[address]  °[place]  [address]  °[phone]  ° °

## 2015-01-04 NOTE — ED Notes (Signed)
Pt c/o upper right abd pain, fever that started Sunday, denies any n/v/d, denies any cough,

## 2015-01-04 NOTE — ED Provider Notes (Signed)
CSN: [id number]     Arrival date & time 01/04/15  1918 History  This chart was scribed for [name] Hong, MD by Elon Spanner, ED Scribe. This patient was seen in room APA06/APA06 and the patient's care was started at 9:00 PM.   Chief Complaint  Patient presents with  . Fever   The history is provided by the patient. No language interpreter was used.   HPI Comments: [name] [name] is a 39 y.o. male who presents to the Emergency Department complaining of epigastric abdominal pain onset 4-5 days ago.  He reports an associated fever TMAX 102 onset 3 days ago.  He describes the pain initially as bloating but, with worsening the day after onset, the quality changed to aching.  There is no nausea and no post-prandial pain, however, the complaint is worsened by the bouncing motion of riding in a car.  He reports a cough normal to baseline (current smoker).  He denies blood in stool, constipation dysuria, n/v/d.   Past Medical History  Diagnosis Date  . Renal disorder    Past Surgical History  Procedure Laterality Date  . Hand surgery    . Hand surgery     Family History  Problem Relation Age of Onset  . Diabetes Mother   . Diabetes Father    History  Substance Use Topics  . Smoking status: Current Every Day Smoker -- 1.00 packs/day    Types: Cigarettes  . Smokeless tobacco: Not on file  . Alcohol Use: Yes     Comment: rarely    Review of Systems  Constitutional: Positive for fever.  Gastrointestinal: Positive for abdominal pain.  All other systems reviewed and are negative.     Allergies  Tramadol and Biaxin  Home Medications   Prior to Admission medications   Medication Sig Start Date End Date Taking? Authorizing Provider  amoxicillin-clavulanate (AUGMENTIN) 875-125 MG per tablet Take 1 tablet by mouth every 12 (twelve) hours. 01/04/15   [name] Hong, MD  oxyCODONE-acetaminophen (PERCOCET) 5-325 MG per tablet Take 1 tablet by mouth every 4 (four) hours as needed. 01/04/15    [name] Hong, MD   BP 124/75 mmHg  Pulse 89  Temp(Src) 101.8 F (38.8 C) (Oral)  Resp 20  Ht 5\' 11"  (1.803 m)  Wt 160 lb (72.576 kg)  BMI 22.33 kg/m2  SpO2 99% Physical Exam  Constitutional: He appears well-developed and well-nourished. No distress.  HENT:  Head: Normocephalic and atraumatic.  Mouth/Throat: Oropharynx is clear and moist. No oropharyngeal exudate.  Conjunctiva clear; no icterus  Eyes: Conjunctivae and EOM are normal. Pupils are equal, round, and reactive to light. Right eye exhibits no discharge. Left eye exhibits no discharge. No scleral icterus.  Neck: Normal range of motion. Neck supple. No JVD present. No thyromegaly present.  Cardiovascular: Normal rate, regular rhythm, normal heart sounds and intact distal pulses.  Exam reveals no gallop and no friction rub.   No murmur heard. Pulmonary/Chest: Effort normal and breath sounds normal. No respiratory distress. He has no wheezes. He has no rales.  Abdominal: Soft. Bowel sounds are normal. He exhibits no distension and no mass. There is no tenderness.  Epigastric and right epigastric tenderness no peritoneal signs.  No tympanitic sounds to percussion.    Musculoskeletal: Normal range of motion. He exhibits no edema or tenderness.  Lymphadenopathy:    He has no cervical adenopathy.  Neurological: He is alert. Coordination normal.  Skin: Skin is warm and dry. No rash noted. No erythema.  Psychiatric: He has a normal mood and affect. His behavior is normal.  Nursing note and vitals reviewed.   ED Course  Procedures (including critical care time)  DIAGNOSTIC STUDIES: Oxygen Saturation is 99% on RA, normal by my interpretation.    COORDINATION OF CARE:  9:10 PM Discussed treatment plan with patient at bedside.  Patient acknowledges and agrees with plan.    Labs Review Labs Reviewed  CBC WITH DIFFERENTIAL/PLATELET - Abnormal; Notable for the following:    Monocytes Relative 19 (*)    Monocytes Absolute 2.0  (*)    All other components within normal limits  COMPREHENSIVE METABOLIC PANEL - Abnormal; Notable for the following:    Sodium 133 (*)    Chloride 95 (*)    Glucose, Bld 109 (*)    Total Protein 8.5 (*)    All other components within normal limits  LIPASE, BLOOD - Abnormal; Notable for the following:    Lipase 16 (*)    All other components within normal limits  URINALYSIS, ROUTINE W REFLEX MICROSCOPIC (NOT AT [facility]) - Abnormal; Notable for the following:    Bilirubin Urine SMALL (*)    Urobilinogen, UA 2.0 (*)    All other components within normal limits    Imaging Review Ct Abdomen Pelvis W Contrast  01/04/2015   CLINICAL DATA:  Acute onset of upper mid abdominal pain and fever. Initial encounter.  EXAM: CT ABDOMEN AND PELVIS WITH CONTRAST  TECHNIQUE: Multidetector CT imaging of the abdomen and pelvis was performed using the standard protocol following bolus administration of intravenous contrast.  CONTRAST:  OMNIPAQUE IOHEXOL 300 MG/ML  SOLN  COMPARISON:  CT of the abdomen and pelvis performed 03/16/2013  FINDINGS: The visualized lung bases are clear. A nonspecific 1.2 cm pleural-based nodule is noted at the anterior right lung base, new from 2014.  There appears to be a unusually prominent node adjacent to the hepatic hilum and pancreatic head, measuring 2.3 cm in short axis. This is significantly more prominent than on the prior study, and of uncertain significance.  The liver and spleen are unremarkable in appearance. The gallbladder is within normal limits. The pancreas and adrenal glands are unremarkable.  A few mildly prominent periaortic nodes measure up to 1.2 cm in short axis. A few mildly prominent nodes are also seen about the adrenal glands bilaterally.  The kidneys are unremarkable in appearance. There is no evidence of hydronephrosis. No renal or ureteral stones are seen. No perinephric stranding is appreciated.  No free fluid is identified. The small bowel is unremarkable  in appearance. The stomach is within normal limits. No acute vascular abnormalities are seen. Incidental note is made of a circumaortic left renal vein.  The appendix is not definitely seen; there is no evidence of appendicitis. The colon is partially filled with stool and is unremarkable in appearance.  The bladder is mildly distended and grossly unremarkable. The prostate is difficult to fully assess, but appears grossly unremarkable. No inguinal lymphadenopathy is seen.  No acute osseous abnormalities are identified.  IMPRESSION: 1. Significantly enlarged 2.3 cm node adjacent to the hepatic hilum and pancreatic head, with additional mildly prominent periaortic nodes and a few prominent nodes adjacent to the adrenal glands. This is new from 2014 and of uncertain significance. Metastatic disease or lymphoma cannot be excluded. Scrotal ultrasound and PET/CT would be helpful for further evaluation, as deemed clinically appropriate. 2. New 1.2 cm nonspecific pleural-based nodule at the anterior right lung base. This would also be  further assessed on the PET/CT. 3. Circumaortic left renal vein incidentally noted.  These results were called by telephone at the time of interpretation on 01/04/2015 at 10:21 pm to Dr. Eber [name], who verbally acknowledged these results.   Electronically Signed   By: Roanna Raider M.D.   On: 01/04/2015 22:21    MDM   Final diagnoses:  Abdominal pain  LAD (lymphadenopathy), intra-abdominal    CT scan confirms pathology in the form of lymphadenopathy which is significant and needs further evaluation with PET scan, scrotal ultrasound, evaluation for lymphoma or metastatic disease. The patient will be given a copy of his results, a list of family doctors with whom to follow up, the fever and the abdominal pain without signs of abscess or intra-abdominal infection is somewhat nonspecific. Will give medications as below, the patient is in total understanding of the results and  indications for follow-up and need for further testing, he will make phone calls in the morning to set up with a family doctor. The patient appears stable for discharge without tachycardia or hypotension. There was no leukocytosis.  Meds given in ED:  Medications  acetaminophen (TYLENOL) tablet 650 mg (650 mg Oral Given 01/04/15 1947)  iohexol (OMNIPAQUE) 300 MG/ML solution 25 mL (25 mLs Oral Contrast Given 01/04/15 2145)  iohexol (OMNIPAQUE) 300 MG/ML solution 100 mL (100 mLs Intravenous Contrast Given 01/04/15 2145)  HYDROmorphone (DILAUDID) injection 1 mg (1 mg Intravenous Given 01/04/15 2203)    New Prescriptions   AMOXICILLIN-CLAVULANATE (AUGMENTIN) 875-125 MG PER TABLET    Take 1 tablet by mouth every 12 (twelve) hours.   OXYCODONE-ACETAMINOPHEN (PERCOCET) 5-325 MG PER TABLET    Take 1 tablet by mouth every 4 (four) hours as needed.      I personally performed the services described in this documentation, which was scribed in my presence. The recorded information has been reviewed and is accurate.      [name] Hong, MD 01/04/15 [phone]

## 2015-01-13 ENCOUNTER — Encounter (HOSPITAL_COMMUNITY): Payer: Self-pay | Admitting: *Deleted

## 2015-01-13 ENCOUNTER — Emergency Department (HOSPITAL_COMMUNITY)
Admission: EM | Admit: 2015-01-13 | Discharge: 2015-01-14 | Disposition: A | Discharge status: Home / Self Care | Payer: Medicaid Other | Attending: Emergency Medicine | Admitting: Emergency Medicine

## 2015-01-13 DIAGNOSIS — Z720 Tobacco use: Secondary | ICD-10-CM | POA: Insufficient documentation

## 2015-01-13 DIAGNOSIS — R590 Localized enlarged lymph nodes: Secondary | ICD-10-CM | POA: Insufficient documentation

## 2015-01-13 DIAGNOSIS — R509 Fever, unspecified: Secondary | ICD-10-CM | POA: Insufficient documentation

## 2015-01-13 DIAGNOSIS — R61 Generalized hyperhidrosis: Secondary | ICD-10-CM | POA: Insufficient documentation

## 2015-01-13 DIAGNOSIS — Z87448 Personal history of other diseases of urinary system: Secondary | ICD-10-CM | POA: Diagnosis not present

## 2015-01-13 DIAGNOSIS — R1013 Epigastric pain: Secondary | ICD-10-CM | POA: Diagnosis not present

## 2015-01-13 DIAGNOSIS — Z8619 Personal history of other infectious and parasitic diseases: Secondary | ICD-10-CM | POA: Insufficient documentation

## 2015-01-13 DIAGNOSIS — R7989 Other specified abnormal findings of blood chemistry: Secondary | ICD-10-CM | POA: Diagnosis not present

## 2015-01-13 DIAGNOSIS — M545 Low back pain: Secondary | ICD-10-CM | POA: Diagnosis not present

## 2015-01-13 HISTORY — DX: Chronic viral hepatitis C: B18.2

## 2015-01-13 LAB — CBC WITH DIFFERENTIAL/PLATELET
Basophils Absolute: 0 10*3/uL (ref 0.0–0.1)
Basophils Relative: 0 % (ref 0–1)
Eosinophils Absolute: 0.1 10*3/uL (ref 0.0–0.7)
Eosinophils Relative: 0 % (ref 0–5)
HCT: 38.1 % — ABNORMAL LOW (ref 39.0–52.0)
Hemoglobin: 13.2 g/dL (ref 13.0–17.0)
Lymphocytes Relative: 7 % — ABNORMAL LOW (ref 12–46)
Lymphs Abs: 0.9 10*3/uL (ref 0.7–4.0)
MCH: 30.8 pg (ref 26.0–34.0)
MCHC: 34.6 g/dL (ref 30.0–36.0)
MCV: 89 fL (ref 78.0–100.0)
Monocytes Absolute: 0.4 10*3/uL (ref 0.1–1.0)
Monocytes Relative: 3 % (ref 3–12)
Neutro Abs: 11.9 10*3/uL — ABNORMAL HIGH (ref 1.7–7.7)
Neutrophils Relative %: 90 % — ABNORMAL HIGH (ref 43–77)
Platelets: 252 10*3/uL (ref 150–400)
RBC: 4.28 MIL/uL (ref 4.22–5.81)
RDW: 12.7 % (ref 11.5–15.5)
WBC: 13.2 10*3/uL — ABNORMAL HIGH (ref 4.0–10.5)

## 2015-01-13 MED ORDER — SODIUM CHLORIDE 0.9 % IV SOLN
1000.0000 mL | Freq: Once | INTRAVENOUS | Status: AC
  Administered 2015-01-13: 1000 mL via INTRAVENOUS

## 2015-01-13 MED ORDER — GI COCKTAIL ~~LOC~~
30.0000 mL | Freq: Once | ORAL | Status: AC
  Administered 2015-01-13: 30 mL via ORAL
  Filled 2015-01-13: qty 30

## 2015-01-13 MED ORDER — PANTOPRAZOLE SODIUM 40 MG IV SOLR
40.0000 mg | Freq: Once | INTRAVENOUS | Status: AC
  Administered 2015-01-13: 40 mg via INTRAVENOUS
  Filled 2015-01-13: qty 40

## 2015-01-13 MED ORDER — SODIUM CHLORIDE 0.9 % IV SOLN: 1000.0000 mL | INTRAVENOUS | Status: DC

## 2015-01-13 NOTE — ED Notes (Signed)
Pt. Reports he was seen here recently for abdominal pain. Pt. Reports he was unable to follow-up at this time due to no insurance. Pt. Reports that he has filed for insurance on Tuesday. Pt. Reports that he took pain medication and antibiotics as prescribed and reports that pain got better. Pt. Reports pain became worse again yesterday and fever came back. Pt. Denies n/v/d.

## 2015-01-13 NOTE — ED Provider Notes (Signed)
CSN: [id number]     Arrival date & time 01/13/15  2212 History  This chart was scribed for Dione Booze, MD by Placido Sou, ED scribe. This patient was seen in room APA09/APA09 and the patient's care was started at 11:30 PM.   Chief Complaint  Patient presents with  . Abdominal Pain   The history is provided by the patient. No language interpreter was used.    HPI Comments: [name] [name] is a 39 y.o. male who presents to the Emergency Department complaining of worsening, moderate, epigastric pain with onset 2 days ago. Pt notes having come to [facility] 9 days ago with abd pain that he described as "gas", further notes his symptoms alleviated for 7 days and came back  2 days ago and worsened. He describes the current  pain as "being punched in the gut", further notes an associated fever with onset last night (TMAX 101.5), mild back pain and diaphoresis.  He rates his current abd pain as 6/10, notes worsening pain with movement and denies anything has alleviated his pain. Pt notes a mild appetite change and having lost 8-9 pounds in the last 2 months. He denies having taken any tylenol or ibuprofen for pain management. He denies any current health coverage.   Past Medical History  Diagnosis Date  . Renal disorder   . Hep C w/o coma, chronic    Past Surgical History  Procedure Laterality Date  . Hand surgery    . Hand surgery     Family History  Problem Relation Age of Onset  . Diabetes Mother   . Diabetes Father    History  Substance Use Topics  . Smoking status: Current Every Day Smoker -- 1.00 packs/day    Types: Cigarettes  . Smokeless tobacco: Not on file  . Alcohol Use: Yes     Comment: rarely    Review of Systems  Constitutional: Positive for fever, chills, diaphoresis and appetite change.  Gastrointestinal: Positive for abdominal pain.  Musculoskeletal: Positive for back pain.  All other systems reviewed and are negative.   Allergies  Tramadol and Biaxin  Home  Medications   Prior to Admission medications   Medication Sig Start Date End Date Taking? Authorizing Provider  amoxicillin-clavulanate (AUGMENTIN) 875-125 MG per tablet Take 1 tablet by mouth every 12 (twelve) hours. 01/04/15  Yes Eber Hong, MD  oxyCODONE-acetaminophen (PERCOCET) 5-325 MG per tablet Take 1 tablet by mouth every 4 (four) hours as needed. 01/04/15  Yes Eber Hong, MD   BP 122/80 mmHg  Pulse 118  Temp(Src) 99.2 F (37.3 C) (Oral)  Resp 20  Ht  (1.803 m)  Wt 155 lb (70.308 kg)  BMI 21.63 kg/m2  SpO2 94% Physical Exam  Constitutional: He is oriented to person, place, and time. He appears well-developed and well-nourished.  HENT:  Head: Normocephalic and atraumatic.  Eyes: Conjunctivae and EOM are normal. Pupils are equal, round, and reactive to light.  Neck: Normal range of motion. Neck supple. No JVD present.  Cardiovascular: Normal rate, regular rhythm and normal heart sounds.   No murmur heard. Pulmonary/Chest: Effort normal and breath sounds normal. He has no wheezes. He has no rales. He exhibits no tenderness.  Abdominal: Soft. He exhibits no distension and no mass. There is tenderness. There is no rebound and no guarding.  mild epigastric tenderness without rebound or guarding  Genitourinary: Right testis shows no mass. Left testis shows no mass. Circumcised.  circumcised penis; testes descended without masses; shotty  bilateral inguinal adenopathy   Musculoskeletal: Normal range of motion. He exhibits no edema.  Lymphadenopathy:    He has no cervical adenopathy.       Right: Inguinal adenopathy present.       Left: Inguinal adenopathy present.  Neurological: He is alert and oriented to person, place, and time. No cranial nerve deficit. He exhibits normal muscle tone. Coordination normal.  Skin: Skin is warm and dry. No rash noted.  Psychiatric: He has a normal mood and affect. His behavior is normal.    ED Course  Procedures  DIAGNOSTIC  STUDIES: Oxygen Saturation is 94% on RA, adequate by my interpretation.    COORDINATION OF CARE: 11:40 PM Discussed treatment plan with pt at bedside and pt agreed to plan.  Labs Review Results for orders placed or performed during the hospital encounter of 01/13/15  Comprehensive metabolic panel  Result Value Ref Range   Sodium 133 (L) 135 - 145 mmol/L   Potassium 3.8 3.5 - 5.1 mmol/L   Chloride 99 (L) 101 - 111 mmol/L   CO2 26 22 - 32 mmol/L   Glucose, Bld 92 65 - 99 mg/dL   BUN 23 (H) 6 - 20 mg/dL   Creatinine, Ser 1.61 (H) 0.61 - 1.24 mg/dL   Calcium 8.5 (L) 8.9 - 10.3 mg/dL   Total Protein 8.3 (H) 6.5 - 8.1 g/dL   Albumin 3.4 (L) 3.5 - 5.0 g/dL   AST 20 15 - 41 U/L   ALT 14 (L) 17 - 63 U/L   Alkaline Phosphatase 100 38 - 126 U/L   Total Bilirubin 0.3 0.3 - 1.2 mg/dL   GFR calc non Af Amer >60 >60 mL/min   GFR calc Af Amer >60 >60 mL/min   Anion gap 8 5 - 15  Lipase, blood  Result Value Ref Range   Lipase 22 22 - 51 U/L  CBC with Differential  Result Value Ref Range   WBC 13.2 (H) 4.0 - 10.5 K/uL   RBC 4.28 4.22 - 5.81 MIL/uL   Hemoglobin 13.2 13.0 - 17.0 g/dL   HCT 09.6 (L) 04.5 - 40.9 %   MCV 89.0 78.0 - 100.0 fL   MCH 30.8 26.0 - 34.0 pg   MCHC 34.6 30.0 - 36.0 g/dL   RDW 81.1 91.4 - 78.2 %   Platelets 252 150 - 400 K/uL   Neutrophils Relative % 90 (H) 43 - 77 %   Neutro Abs 11.9 (H) 1.7 - 7.7 K/uL   Lymphocytes Relative 7 (L) 12 - 46 %   Lymphs Abs 0.9 0.7 - 4.0 K/uL   Monocytes Relative 3 3 - 12 %   Monocytes Absolute 0.4 0.1 - 1.0 K/uL   Eosinophils Relative 0 0 - 5 %   Eosinophils Absolute 0.1 0.0 - 0.7 K/uL   Basophils Relative 0 0 - 1 %   Basophils Absolute 0.0 0.0 - 0.1 K/uL  Urinalysis, Routine w reflex microscopic (not at [facility])  Result Value Ref Range   Color, Urine YELLOW YELLOW   APPearance CLEAR CLEAR   Specific Gravity, Urine 1.010 1.005 - 1.030   pH 5.5 5.0 - 8.0   Glucose, UA NEGATIVE NEGATIVE mg/dL   Hgb urine dipstick TRACE (A)  NEGATIVE   Bilirubin Urine NEGATIVE NEGATIVE   Ketones, ur NEGATIVE NEGATIVE mg/dL   Protein, ur NEGATIVE NEGATIVE mg/dL   Urobilinogen, UA 0.2 0.0 - 1.0 mg/dL   Nitrite NEGATIVE NEGATIVE   Leukocytes, UA NEGATIVE NEGATIVE  Urine microscopic-add  on  Result Value Ref Range   Squamous Epithelial / LPF RARE RARE   WBC, UA 0-2 <3 WBC/hpf   RBC / HPF 0-2 <3 RBC/hpf   Bacteria, UA RARE RARE  I-Stat CG4 Lactic Acid, ED  Result Value Ref Range   Lactic Acid, Venous 0.67 0.5 - 2.0 mmol/L   MDM   Final diagnoses:  Epigastric pain  Elevated serum creatinine    Abdominal pain of uncertain cause. Old records are reviewed and he did have CT scan showing some adenopathy with recommendations for outpatient PET scan. He has been unable to make this happen because of insurance issues. He is given a trial of the GI cocktail with no relief. Is given a dose of morphine with significant relief. Laboratory workup does show elevation of BUN/creatinine compared with baseline, but this elevation is modest. He is encouraged to drink fluids. He is encouraged to follow-up with the [facility] health Department. He is advised to take over-the-counter omeprazole. He is requesting a prescription for hydromorphone because he states that he cannot take acetaminophen because of his hepatitis C. He is advised that he would not get a prescription for hydromorphone. He is given a prescription for small number of oxycodone tablets.  I personally performed the services described in this documentation, which was scribed in my presence. The recorded information has been reviewed and is accurate.    Dione Booze, MD 01/14/15 [phone]

## 2015-01-13 NOTE — ED Notes (Signed)
Pt with continued abd pain, denies N/V/D, + mild fever

## 2015-01-14 LAB — COMPREHENSIVE METABOLIC PANEL
ALT: 14 U/L — ABNORMAL LOW (ref 17–63)
AST: 20 U/L (ref 15–41)
Albumin: 3.4 g/dL — ABNORMAL LOW (ref 3.5–5.0)
Alkaline Phosphatase: 100 U/L (ref 38–126)
Anion gap: 8 (ref 5–15)
BUN: 23 mg/dL — ABNORMAL HIGH (ref 6–20)
CO2: 26 mmol/L (ref 22–32)
Calcium: 8.5 mg/dL — ABNORMAL LOW (ref 8.9–10.3)
Chloride: 99 mmol/L — ABNORMAL LOW (ref 101–111)
Creatinine, Ser: 1.3 mg/dL — ABNORMAL HIGH (ref 0.61–1.24)
GFR calc Af Amer: >60 mL/min (ref >60)
GFR calc non Af Amer: >60 mL/min (ref >60)
Glucose, Bld: 92 mg/dL (ref 65–99)
Potassium: 3.8 mmol/L (ref 3.5–5.1)
Sodium: 133 mmol/L — ABNORMAL LOW (ref 135–145)
Total Bilirubin: 0.3 mg/dL (ref 0.3–1.2)
Total Protein: 8.3 g/dL — ABNORMAL HIGH (ref 6.5–8.1)

## 2015-01-14 LAB — LIPASE, BLOOD: Lipase: 22 U/L (ref 22–51)

## 2015-01-14 LAB — URINE MICROSCOPIC-ADD ON

## 2015-01-14 LAB — URINALYSIS, ROUTINE W REFLEX MICROSCOPIC
Bilirubin Urine: NEGATIVE
Glucose, UA: NEGATIVE mg/dL
Ketones, ur: NEGATIVE mg/dL
Leukocytes, UA: NEGATIVE
Nitrite: NEGATIVE
Protein, ur: NEGATIVE mg/dL
Specific Gravity, Urine: 1.01 (ref 1.005–1.030)
Urobilinogen, UA: 0.2 mg/dL (ref 0.0–1.0)
pH: 5.5 (ref 5.0–8.0)

## 2015-01-14 LAB — I-STAT CG4 LACTIC ACID, ED: Lactic Acid, Venous: 0.67 mmol/L (ref 0.5–2.0)

## 2015-01-14 MED ORDER — OXYCODONE HCL 5 MG PO TABS: 5.0000 mg | ORAL_TABLET | ORAL | Status: DC | PRN

## 2015-01-14 MED ORDER — MORPHINE SULFATE 4 MG/ML IJ SOLN
4.0000 mg | Freq: Once | INTRAMUSCULAR | Status: AC
  Administered 2015-01-14: 4 mg via INTRAVENOUS
  Filled 2015-01-14: qty 1

## 2015-01-14 NOTE — Discharge Instructions (Signed)
Take omeprazole (Prilosec OTC) once a day. Follow up woth the [facility] regarding your CT scan findings. Drink plenty of fluids.  Abdominal Pain Many things can cause abdominal pain. Usually, abdominal pain is not caused by a disease and will improve without treatment. It can often be observed and treated at home. Your health care provider will do a physical exam and possibly order blood tests and X-rays to help determine the seriousness of your pain. However, in many cases, more time must pass before a clear cause of the pain can be found. Before that point, your health care provider may not know if you need more testing or further treatment. HOME CARE INSTRUCTIONS  Monitor your abdominal pain for any changes. The following actions may help to alleviate any discomfort you are experiencing:  Only take over-the-counter or prescription medicines as directed by your health care provider.  Do not take laxatives unless directed to do so by your health care provider.  Try a clear liquid diet (broth, tea, or water) as directed by your health care provider. Slowly move to a bland diet as tolerated. SEEK MEDICAL CARE IF:  You have unexplained abdominal pain.  You have abdominal pain associated with nausea or diarrhea.  You have pain when you urinate or have a bowel movement.  You experience abdominal pain that wakes you in the night.  You have abdominal pain that is worsened or improved by eating food.  You have abdominal pain that is worsened with eating fatty foods.  You have a fever. SEEK IMMEDIATE MEDICAL CARE IF:   Your pain does not go away within 2 hours.  You keep throwing up (vomiting).  Your pain is felt only in portions of the abdomen, such as the right side or the left lower portion of the abdomen.  You pass bloody or black tarry stools. MAKE SURE YOU:  Understand these instructions.   Will watch your condition.   Will get help right away if you are not doing  well or get worse.  Document Released: 03/12/2005 Document Revised: 06/07/2013 Document Reviewed: 02/09/2013 [facility] Patient Information 2015 [place], [place]. This information is not intended to replace advice given to you by your health care provider. Make sure you discuss any questions you have with your health care provider.  Oxycodone tablets or capsules What is this medicine? OXYCODONE (ox i KOE done) is a pain reliever. It is used to treat moderate to severe pain. This medicine may be used for other purposes; ask your health care provider or pharmacist if you have questions. COMMON BRAND NAME(S): Dazidox, Endocodone, OXECTA, OxyIR, Percolone, Roxicodone What should I tell my health care provider before I take this medicine? They need to know if you have any of these conditions: -Addison's disease -brain tumor -drug abuse or addiction -head injury -heart disease -if you frequently drink alcohol containing drinks -kidney disease or problems going to the bathroom -liver disease -lung disease, asthma, or breathing problems -mental problems -an unusual or allergic reaction to oxycodone, codeine, hydrocodone, morphine, other medicines, foods, dyes, or preservatives -pregnant or trying to get pregnant -breast-feeding How should I use this medicine? Take this medicine by mouth with a glass of water. Follow the directions on the prescription label. You can take it with or without food. If it upsets your stomach, take it with food. Take your medicine at regular intervals. Do not take it more often than directed. Do not stop taking except on your doctor's advice. Some brands of this medicine,  like Oxecta, have special instructions. Ask your doctor or pharmacist if these directions are for you: Do not cut, crush or chew this medicine. Swallow only one tablet at a time. Do not wet, soak, or lick the tablet before you take it. Talk to your pediatrician regarding the use of this medicine in  children. Special care may be needed. Overdosage: If you think you have taken too much of this medicine contact a poison control center or emergency room at once. NOTE: This medicine is only for you. Do not share this medicine with others. What if I miss a dose? If you miss a dose, take it as soon as you can. If it is almost time for your next dose, take only that dose. Do not take double or extra doses. What may interact with this medicine? -alcohol -antihistamines -certain medicines used for nausea like chlorpromazine, droperidol -erythromycin -ketoconazole -medicines for depression, anxiety, or psychotic disturbances -medicines for sleep -muscle relaxants -naloxone -naltrexone -narcotic medicines (opiates) for pain -nilotinib -phenobarbital -phenytoin -rifampin -ritonavir -voriconazole This list may not describe all possible interactions. Give your health care provider a list of all the medicines, herbs, non-prescription drugs, or dietary supplements you use. Also tell them if you smoke, drink alcohol, or use illegal drugs. Some items may interact with your medicine. What should I watch for while using this medicine? Tell your doctor or health care professional if your pain does not go away, if it gets worse, or if you have new or a different type of pain. You may develop tolerance to the medicine. Tolerance means that you will need a higher dose of the medicine for pain relief. Tolerance is normal and is expected if you take this medicine for a long time. Do not suddenly stop taking your medicine because you may develop a severe reaction. Your body becomes used to the medicine. This does NOT mean you are addicted. Addiction is a behavior related to getting and using a drug for a non-medical reason. If you have pain, you have a medical reason to take pain medicine. Your doctor will tell you how much medicine to take. If your doctor wants you to stop the medicine, the dose will be slowly  lowered over time to avoid any side effects. You may get drowsy or dizzy when you first start taking this medicine or change doses. Do not drive, use machinery, or do anything that may be dangerous until you know how the medicine affects you. Stand or sit up slowly. There are different types of narcotic medicines (opiates) for pain. If you take more than one type at the same time, you may have more side effects. Give your health care provider a list of all medicines you use. Your doctor will tell you how much medicine to take. Do not take more medicine than directed. Call emergency for help if you have problems breathing. This medicine will cause constipation. Try to have a bowel movement at least every 2 to 3 days. If you do not have a bowel movement for 3 days, call your doctor or health care professional. Your mouth may get dry. Drinking water, chewing sugarless gum, or sucking on hard candy may help. See your dentist every 6 months. What side effects may I notice from receiving this medicine? Side effects that you should report to your doctor or health care professional as soon as possible: -allergic reactions like skin rash, itching or hives, swelling of the face, lips, or tongue -breathing problems -confusion -feeling faint  or lightheaded, falls -trouble passing urine or change in the amount of urine -unusually weak or tired Side effects that usually do not require medical attention (report to your doctor or health care professional if they continue or are bothersome): -constipation -dry mouth -itching -nausea, vomiting -upset stomach This list may not describe all possible side effects. Call your doctor for medical advice about side effects. You may report side effects to FDA at 1-800-FDA-1088. Where should I keep my medicine? Keep out of the reach of children. This medicine can be abused. Keep your medicine in a safe place to protect it from theft. Do not share this medicine with anyone.  Selling or giving away this medicine is dangerous and against the law. Store at room temperature between 15 and 30 degrees C (59 and 86 degrees F). Protect from light. Keep container tightly closed. This medicine may cause accidental overdose and death if it is taken by other adults, children, or pets. Flush any unused medicine down the toilet to reduce the chance of harm. Do not use the medicine after the expiration date. NOTE: This sheet is a summary. It may not cover all possible information. If you have questions about this medicine, talk to your doctor, pharmacist, or health care provider.  2015, [employer]. (2013-02-10 13:43:33)

## 2015-05-26 ENCOUNTER — Emergency Department (HOSPITAL_COMMUNITY)
Admission: EM | Admit: 2015-05-26 | Discharge: 2015-05-26 | Disposition: A | Discharge status: Home / Self Care | Payer: Medicaid Other | Attending: Emergency Medicine | Admitting: Emergency Medicine

## 2015-05-26 ENCOUNTER — Encounter (HOSPITAL_COMMUNITY): Payer: Self-pay | Admitting: Emergency Medicine

## 2015-05-26 DIAGNOSIS — Z8619 Personal history of other infectious and parasitic diseases: Secondary | ICD-10-CM | POA: Insufficient documentation

## 2015-05-26 DIAGNOSIS — F17210 Nicotine dependence, cigarettes, uncomplicated: Secondary | ICD-10-CM | POA: Insufficient documentation

## 2015-05-26 DIAGNOSIS — K0889 Other specified disorders of teeth and supporting structures: Secondary | ICD-10-CM

## 2015-05-26 DIAGNOSIS — Z87448 Personal history of other diseases of urinary system: Secondary | ICD-10-CM | POA: Insufficient documentation

## 2015-05-26 DIAGNOSIS — K029 Dental caries, unspecified: Secondary | ICD-10-CM | POA: Diagnosis not present

## 2015-05-26 MED ORDER — HYDROCODONE-ACETAMINOPHEN 5-325 MG PO TABS
1.0000 | ORAL_TABLET | Freq: Once | ORAL | Status: AC
  Administered 2015-05-26: 1 via ORAL
  Filled 2015-05-26: qty 1

## 2015-05-26 MED ORDER — PENICILLIN V POTASSIUM 250 MG PO TABS
500.0000 mg | ORAL_TABLET | Freq: Once | ORAL | Status: AC
  Administered 2015-05-26: 500 mg via ORAL
  Filled 2015-05-26: qty 2

## 2015-05-26 MED ORDER — PENICILLIN V POTASSIUM 500 MG PO TABS: 500.0000 mg | ORAL_TABLET | Freq: Four times a day (QID) | ORAL | Status: AC

## 2015-05-26 MED ORDER — DICLOFENAC SODIUM 75 MG PO TBEC: 75.0000 mg | DELAYED_RELEASE_TABLET | Freq: Two times a day (BID) | ORAL | Status: DC

## 2015-05-26 NOTE — ED Notes (Signed)
PT c/o left upper dental pain worsening x3 days. PT states previous pain to same tooth.

## 2015-05-26 NOTE — ED Provider Notes (Signed)
CSN: [id number]     Arrival date & time 05/26/15  1410 History   First MD Initiated Contact with Patient 05/26/15 1426     Chief Complaint  Patient presents with  . Dental Pain     (Consider location/radiation/quality/duration/timing/severity/associated sxs/prior Treatment) HPI   [name] [name] is a 39 y.o. male who presents to the Emergency Department complaining of left upper dental pain for 3 days.  Reports hx of same.  Had appt with a dentist, but was unable to keep the appt and now pain has worsened.  Pain is worse with chewing and cold air  He denies facial swelling, fever, chills, neck pain.  He has been applying a topical anesthetic agent with minimal relief.     Past Medical History  Diagnosis Date  . Renal disorder   . Hep C w/o coma, chronic (HCC)    Past Surgical History  Procedure Laterality Date  . Hand surgery    . Hand surgery     Family History  Problem Relation Age of Onset  . Diabetes Mother   . Diabetes Father    Social History  Substance Use Topics  . Smoking status: Current Every Day Smoker -- 1.00 packs/day    Types: Cigarettes  . Smokeless tobacco: None  . Alcohol Use: Yes     Comment: rarely    Review of Systems  Constitutional: Negative for fever and appetite change.  HENT: Positive for dental problem. Negative for congestion, facial swelling, sore throat and trouble swallowing.   Eyes: Negative for pain and visual disturbance.  Musculoskeletal: Negative for neck pain and neck stiffness.  Neurological: Negative for dizziness, facial asymmetry and headaches.  Hematological: Negative for adenopathy.  All other systems reviewed and are negative.     Allergies  Tramadol and Biaxin  Home Medications   Prior to Admission medications   Medication Sig Start Date End Date Taking? Authorizing Provider  amoxicillin-clavulanate (AUGMENTIN) 875-125 MG per tablet Take 1 tablet by mouth every 12 (twelve) hours. 01/04/15   Eber HongBrian Miller, MD   oxyCODONE (ROXICODONE) 5 MG immediate release tablet Take 1 tablet (5 mg total) by mouth every 4 (four) hours as needed for severe pain. 01/14/15   Dione Boozeavid Glick, MD  oxyCODONE-acetaminophen (PERCOCET) 5-325 MG per tablet Take 1 tablet by mouth every 4 (four) hours as needed. 01/04/15   Eber HongBrian Miller, MD   BP 122/63 mmHg  Pulse 78  Temp(Src) 98.1 F (36.7 C) (Oral)  Resp 18  Ht 5\' 11"  (1.803 m)  Wt 74.844 kg  BMI 23.02 kg/m2  SpO2 100% Physical Exam  Constitutional: He is oriented to person, place, and time. He appears well-developed and well-nourished. No distress.  HENT:  Head: Normocephalic and atraumatic.  Right Ear: Tympanic membrane and ear canal normal.  Left Ear: Tympanic membrane and ear canal normal.  Mouth/Throat: Uvula is midline, oropharynx is clear and moist and mucous membranes are normal. No trismus in the jaw. Dental caries present. No dental abscesses or uvula swelling.  ttp of the left upper first and second molar.  Gums appear to be receeding.  Dental caries of the first molar.  No facial swelling, obvious dental abscess, trismus, or sublingual abnml.    Neck: Normal range of motion. Neck supple.  Cardiovascular: Normal rate, regular rhythm and normal heart sounds.   No murmur heard. Pulmonary/Chest: Effort normal and breath sounds normal.  Musculoskeletal: Normal range of motion.  Lymphadenopathy:    He has no cervical adenopathy.  Neurological: He  is alert and oriented to person, place, and time. He exhibits normal muscle tone. Coordination normal.  Skin: Skin is warm and dry.  Nursing note and vitals reviewed.   ED Course  Procedures (including critical care time) Labs Review Labs Reviewed - No data to display  Imaging Review No results found. I have personally reviewed and evaluated these images and lab results as part of my medical decision-making.    MDM   Final diagnoses:  Pain, dental   Pt reviewed on the [place] narcotic database.  Received  suboxone 8 mg for 7 day supply on 03/31/15, no other meds on file.  Pt well appearing.  Airway patent.  No concerning sx's for acute abscess or Ludwig's angina.       Pauline Aus, PA-C 05/29/15 2043  Vanetta Mulders, MD 05/31/15 1310

## 2015-10-23 DIAGNOSIS — F1410 Cocaine abuse, uncomplicated: Secondary | ICD-10-CM | POA: Insufficient documentation

## 2015-10-23 DIAGNOSIS — F17210 Nicotine dependence, cigarettes, uncomplicated: Secondary | ICD-10-CM | POA: Diagnosis not present

## 2015-10-24 ENCOUNTER — Encounter (HOSPITAL_COMMUNITY): Payer: Self-pay

## 2015-10-24 ENCOUNTER — Emergency Department (HOSPITAL_COMMUNITY)
Admission: EM | Admit: 2015-10-24 | Discharge: 2015-10-24 | Disposition: A | Discharge status: Home / Self Care | Payer: Medicaid Other | Attending: Emergency Medicine | Admitting: Emergency Medicine

## 2015-10-24 LAB — COMPREHENSIVE METABOLIC PANEL
ALT: 20 U/L (ref 17–63)
AST: 23 U/L (ref 15–41)
Albumin: 3.7 g/dL (ref 3.5–5.0)
Alkaline Phosphatase: 80 U/L (ref 38–126)
Anion gap: 9 (ref 5–15)
BUN: 14 mg/dL (ref 6–20)
CO2: 27 mmol/L (ref 22–32)
Calcium: 9.5 mg/dL (ref 8.9–10.3)
Chloride: 103 mmol/L (ref 101–111)
Creatinine, Ser: 1.14 mg/dL (ref 0.61–1.24)
GFR calc Af Amer: >60 mL/min (ref >60)
GFR calc non Af Amer: >60 mL/min (ref >60)
Glucose, Bld: 105 mg/dL — ABNORMAL HIGH (ref 65–99)
Potassium: 4.2 mmol/L (ref 3.5–5.1)
Sodium: 139 mmol/L (ref 135–145)
Total Bilirubin: 0.3 mg/dL (ref 0.3–1.2)
Total Protein: 8.3 g/dL — ABNORMAL HIGH (ref 6.5–8.1)

## 2015-10-24 LAB — RAPID URINE DRUG SCREEN, HOSP PERFORMED
Amphetamines: NOT DETECTED
Barbiturates: NOT DETECTED
Benzodiazepines: POSITIVE — AB
Cocaine: POSITIVE — AB
Opiates: NOT DETECTED
Tetrahydrocannabinol: NOT DETECTED

## 2015-10-24 LAB — CBC
HCT: 42.3 % (ref 39.0–52.0)
Hemoglobin: 14.2 g/dL (ref 13.0–17.0)
MCH: 30.5 pg (ref 26.0–34.0)
MCHC: 33.6 g/dL (ref 30.0–36.0)
MCV: 90.8 fL (ref 78.0–100.0)
Platelets: 245 10*3/uL (ref 150–400)
RBC: 4.66 MIL/uL (ref 4.22–5.81)
RDW: 13.3 % (ref 11.5–15.5)
WBC: 6.4 10*3/uL (ref 4.0–10.5)

## 2015-10-24 LAB — ETHANOL: Alcohol, Ethyl (B): <5 mg/dL (ref <5)

## 2015-10-24 NOTE — ED Notes (Signed)
Pt is here requesting detox from cocaine, last use was last night (10/23/15) at 8pm. He has been using on and off for about a year. Denies SI/HI. He also admits to taking Suboxone today.

## 2015-10-24 NOTE — ED Notes (Signed)
Pt seen ambulating out of the department

## 2017-01-29 ENCOUNTER — Encounter (HOSPITAL_COMMUNITY): Payer: Self-pay | Admitting: Emergency Medicine

## 2017-01-29 ENCOUNTER — Emergency Department (HOSPITAL_COMMUNITY)
Admission: EM | Admit: 2017-01-29 | Discharge: 2017-01-30 | Disposition: A | Discharge status: Home / Self Care | Payer: Medicaid Other | Attending: Emergency Medicine | Admitting: Emergency Medicine

## 2017-01-29 DIAGNOSIS — F17210 Nicotine dependence, cigarettes, uncomplicated: Secondary | ICD-10-CM | POA: Diagnosis not present

## 2017-01-29 DIAGNOSIS — L0291 Cutaneous abscess, unspecified: Secondary | ICD-10-CM

## 2017-01-29 DIAGNOSIS — L02413 Cutaneous abscess of right upper limb: Secondary | ICD-10-CM | POA: Insufficient documentation

## 2017-01-29 DIAGNOSIS — R220 Localized swelling, mass and lump, head: Secondary | ICD-10-CM | POA: Diagnosis present

## 2017-01-29 DIAGNOSIS — Z79899 Other long term (current) drug therapy: Secondary | ICD-10-CM | POA: Diagnosis not present

## 2017-01-29 NOTE — ED Triage Notes (Signed)
Pt c/o right elbow redness and swelling x 4 days.

## 2017-01-30 ENCOUNTER — Emergency Department (HOSPITAL_COMMUNITY)
Admission: EM | Admit: 2017-01-30 | Discharge: 2017-01-30 | Disposition: A | Discharge status: Home / Self Care | Payer: Medicaid Other | Source: Home / Self Care

## 2017-01-30 MED ORDER — ONDANSETRON HCL 4 MG PO TABS
4.0000 mg | ORAL_TABLET | Freq: Once | ORAL | Status: AC
  Administered 2017-01-30: 4 mg via ORAL
  Filled 2017-01-30: qty 1

## 2017-01-30 MED ORDER — LIDOCAINE-EPINEPHRINE (PF) 1 %-1:200000 IJ SOLN
10.0000 mL | Freq: Once | INTRAMUSCULAR | Status: AC
  Administered 2017-01-30: 10 mL
  Filled 2017-01-30: qty 30

## 2017-01-30 MED ORDER — CEFUROXIME AXETIL 500 MG PO TABS: 500.0000 mg | ORAL_TABLET | Freq: Two times a day (BID) | ORAL | 0 refills | Status: DC

## 2017-01-30 MED ORDER — LIDOCAINE HCL (PF) 1 % IJ SOLN
INTRAMUSCULAR | Status: AC
  Administered 2017-01-30: 5 mL
  Filled 2017-01-30: qty 5

## 2017-01-30 MED ORDER — CEFTRIAXONE SODIUM 1 G IJ SOLR
1.0000 g | Freq: Once | INTRAMUSCULAR | Status: AC
  Administered 2017-01-30: 1 g via INTRAMUSCULAR
  Filled 2017-01-30: qty 10

## 2017-01-30 MED ORDER — IBUPROFEN 600 MG PO TABS: 600.0000 mg | ORAL_TABLET | Freq: Four times a day (QID) | ORAL | 0 refills | Status: DC

## 2017-01-30 MED ORDER — DOXYCYCLINE HYCLATE 100 MG PO CAPS: 100.0000 mg | ORAL_CAPSULE | Freq: Two times a day (BID) | ORAL | 0 refills | Status: DC

## 2017-01-30 MED ORDER — IBUPROFEN 800 MG PO TABS
800.0000 mg | ORAL_TABLET | Freq: Once | ORAL | Status: AC
  Administered 2017-01-30: 800 mg via ORAL
  Filled 2017-01-30: qty 1

## 2017-01-30 MED ORDER — DOXYCYCLINE HYCLATE 100 MG PO TABS
100.0000 mg | ORAL_TABLET | Freq: Once | ORAL | Status: AC
  Administered 2017-01-30: 100 mg via ORAL
  Filled 2017-01-30: qty 1

## 2017-01-30 MED ORDER — OXYCODONE HCL 5 MG PO TABS
5.0000 mg | ORAL_TABLET | Freq: Once | ORAL | Status: AC
  Administered 2017-01-30: 5 mg via ORAL
  Filled 2017-01-30: qty 1

## 2017-01-30 NOTE — Discharge Instructions (Signed)
Please soak wound to your elbow in warm Epsom salt soaks for 10-15 minutes daily until it heals from the inside out. Change the dressing daily. Please use Ceftin and doxycycline 2 times daily with with food. Please see your Medicaid access physician, or return to the emergency department if any changes, problems, or concerns.

## 2017-01-30 NOTE — ED Notes (Signed)
Pt alert & oriented x4, stable gait. Patient given discharge instructions, paperwork & prescription(s). Patient  instructed to stop at the registration desk to finish any additional paperwork. Patient verbalized understanding. Pt left department w/ no further questions. 

## 2017-01-30 NOTE — ED Provider Notes (Signed)
[facility]-EMERGENCY DEPT Provider Note   CSN: [id number] Arrival date & time: 01/29/17  1858     History   Chief Complaint Chief Complaint  Patient presents with  . Abscess    HPI [name] [name] is a 41 y.o. male.  Patient is a 41 year old male who presents to the emergency department with a complaint of abscess of the right elbow.  The patient states this problem is been going on for about 4 days. He states it started out as a pimple. It was itching, and he scratched it and then it started getting larger from that time. In the last 2 days he's noticed that the elbow has gotten swollen and red and warm to touch. He says there is a little bit of drainage present. He was concerned because he is beginning to have some pain and he was also noticing that the elbow area was hot to touch. He states he has not been diagnosed with methicillin-resistant staph in the past, but he does have a history of hepatitis C.      Past Medical History:  Diagnosis Date  . Hep C w/o coma, chronic (HCC)   . Renal disorder     There are no active problems to display for this patient.   Past Surgical History:  Procedure Laterality Date  . HAND SURGERY    . HAND SURGERY         Home Medications    Prior to Admission medications   Medication Sig Start Date End Date Taking? Authorizing Provider  Buprenorphine HCl-Naloxone HCl (SUBOXONE) 8-2 MG FILM Place 1 Film under the tongue 2 (two) times daily.   Yes [provider]  gabapentin (NEURONTIN) 300 MG capsule Take 300 mg by mouth 6 (six) times daily.   Yes [provider]  ibuprofen (ADVIL,MOTRIN) 200 MG tablet Take 600 mg by mouth every 6 (six) hours as needed for fever or moderate pain.   Yes [provider]    Family History Family History  Problem Relation Age of Onset  . Diabetes Mother   . Diabetes Father     Social History Social History  Substance Use Topics  . Smoking status: Current Every Day Smoker   Packs/day: 1.00    Types: Cigarettes  . Smokeless tobacco: Never Used  . Alcohol use Yes     Comment: rarely     Allergies   Tramadol and Biaxin [clarithromycin]   Review of Systems Review of Systems  Constitutional: Negative for activity change.       All ROS Neg except as noted in HPI  HENT: Negative for nosebleeds.   Eyes: Negative for photophobia and discharge.  Respiratory: Negative for cough, shortness of breath and wheezing.   Cardiovascular: Negative for chest pain and palpitations.  Gastrointestinal: Negative for abdominal pain and blood in stool.  Genitourinary: Negative for dysuria, frequency and hematuria.  Musculoskeletal: Negative for arthralgias, back pain and neck pain.  Skin:       Abscess  Neurological: Negative for dizziness, seizures and speech difficulty.  Psychiatric/Behavioral: Negative for confusion and hallucinations.     Physical Exam Updated Vital Signs BP 110/76 (BP Location: Left Arm)   Pulse 79   Temp 99.5 F (37.5 C) (Oral)   Resp 16   Ht 5\' 11"  (1.803 m)   Wt 74.8 kg (165 lb)   SpO2 99%   BMI 23.01 kg/m   Physical Exam  Constitutional: He is oriented to person, place, and time. He appears well-developed  and well-nourished.  Non-toxic appearance.  HENT:  Head: Normocephalic.  Right Ear: Tympanic membrane and external ear normal.  Left Ear: Tympanic membrane and external ear normal.  Eyes: Pupils are equal, round, and reactive to light. EOM and lids are normal.  Neck: Normal range of motion. Neck supple. Carotid bruit is not present.  Cardiovascular: Normal rate, regular rhythm, normal heart sounds, intact distal pulses and normal pulses.   Pulmonary/Chest: Breath sounds normal. No respiratory distress.  Abdominal: Soft. Bowel sounds are normal. There is no tenderness. There is no guarding.  Musculoskeletal: He exhibits tenderness.  There is good range of motion of the right shoulder. There is some decrease in range of motion of the  right elbow. There is a red raised abscess of the right elbow with mild drainage present. There is some red streaking going up the back of the arm. There is full range of motion of the right wrist and fingers. The radial pulses 2+. Capillary refill is less than 2 seconds.  Lymphadenopathy:       Head (right side): No submandibular adenopathy present.       Head (left side): No submandibular adenopathy present.    He has no cervical adenopathy.  Neurological: He is alert and oriented to person, place, and time. He has normal strength. No cranial nerve deficit or sensory deficit.  Skin: Skin is warm and dry.  Psychiatric: He has a normal mood and affect. His speech is normal.  Nursing note and vitals reviewed.    ED Treatments / Results  Labs (all labs ordered are listed, but only abnormal results are displayed) Labs Reviewed  AEROBIC CULTURE (SUPERFICIAL SPECIMEN)    EKG  EKG Interpretation None       Radiology No results found.  Procedures .Marland KitchenIncision and Drainage Date/Time: 01/30/2017 12:44 AM Performed by: Ivery Quale Authorized by: Ivery Quale   Consent:    Consent obtained:  Verbal   Consent given by:  Patient   Risks discussed:  Bleeding, infection and pain Location:    Type:  Abscess   Location:  Upper extremity   Upper extremity location:  Elbow   Elbow location:  R elbow Pre-procedure details:    Skin preparation:  Betadine Anesthesia (see MAR for exact dosages):    Anesthesia method:  Local infiltration   Local anesthetic:  Lidocaine 1% WITH epi Procedure type:    Complexity:  Simple Procedure details:    Incision types:  Single straight   Incision depth:  Subcutaneous   Scalpel blade:  11   Wound management:  Probed and deloculated   Drainage:  Bloody and purulent   Drainage amount:  Moderate   Wound treatment:  Wound left open   Packing materials:  None Post-procedure details:    Patient tolerance of procedure:  Tolerated well, no immediate  complications Comments:     Culture sent to the lab.   (including critical care time)  Medications Ordered in ED Medications  lidocaine-EPINEPHrine (XYLOCAINE-EPINEPHrine) 1 %-1:200000 (PF) injection 10 mL (not administered)  cefTRIAXone (ROCEPHIN) injection 1 g (not administered)  doxycycline (VIBRA-TABS) tablet 100 mg (not administered)  ondansetron (ZOFRAN) tablet 4 mg (not administered)     Initial Impression / Assessment and Plan / ED Course  I have reviewed the triage vital signs and the nursing notes.  Pertinent labs & imaging results that were available during my care of the patient were reviewed by me and considered in my medical decision making (see chart for details).  Final Clinical Impressions(s) / ED Diagnoses MDM Patient states that he noticed a pimple on the right elbow. He scratched it and it became larger, and then developed an abscess. Incision and drainage was carried out. Culture of the drainage was sent to the lab. The patient will be treated with Ceftin and doxycycline. Patient will soak wound in warm Epsom salt soaks daily until the wound heals. He will see his Medicaid access physician, or return to the emergency department if any signs of advancing infection. Pt is on suboxone for pain.   Final diagnoses:  Soft tissue abscess    New Prescriptions New Prescriptions   CEFUROXIME (CEFTIN) 500 MG TABLET    Take 1 tablet (500 mg total) by mouth 2 (two) times daily with a meal.   DOXYCYCLINE (VIBRAMYCIN) 100 MG CAPSULE    Take 1 capsule (100 mg total) by mouth 2 (two) times daily.   IBUPROFEN (ADVIL,MOTRIN) 600 MG TABLET    Take 1 tablet (600 mg total) by mouth 4 (four) times daily.     Ivery Quale, PA-C 01/30/17 1610    Zadie Rhine, MD 01/30/17 [phone]

## 2017-02-01 LAB — AEROBIC CULTURE W GRAM STAIN (SUPERFICIAL SPECIMEN)

## 2017-02-02 ENCOUNTER — Telehealth: Payer: Self-pay | Admitting: Emergency Medicine

## 2017-02-02 NOTE — Telephone Encounter (Signed)
Post ED Visit - Positive Culture Follow-up  Culture report reviewed by antimicrobial stewardship pharmacist:  []  Enzo Bi, Pharm.D. []  Celedonio Miyamoto, Pharm.D., BCPS AQ-ID []  Garvin Fila, Pharm.D., BCPS []  Georgina Pillion, Pharm.D., BCPS []  [place], [place].D., BCPS, AAHIVP []  Estella Husk, Pharm.D., BCPS, AAHIVP []  Lysle Pearl, PharmD, BCPS []  Casilda Carls, PharmD, BCPS []  Pollyann Samples, PharmD, BCPS Sharin Mons PharmD  Positive wound culture Treated with doxycycline and cefuroxime , organism sensitive to the same and no further patient follow-up is required at this time.  Berle Mull 02/02/2017, 9:34 AM

## 2017-04-07 ENCOUNTER — Observation Stay (HOSPITAL_COMMUNITY)
Admission: EM | Admit: 2017-04-07 | Discharge: 2017-04-07 | Disposition: A | Discharge status: Home / Self Care | Payer: Medicaid Other | Attending: Internal Medicine | Admitting: Internal Medicine

## 2017-04-07 ENCOUNTER — Emergency Department (HOSPITAL_COMMUNITY): Payer: Medicaid Other

## 2017-04-07 ENCOUNTER — Encounter (HOSPITAL_COMMUNITY): Payer: Self-pay | Admitting: Emergency Medicine

## 2017-04-07 DIAGNOSIS — L0291 Cutaneous abscess, unspecified: Secondary | ICD-10-CM | POA: Diagnosis present

## 2017-04-07 DIAGNOSIS — Z5321 Procedure and treatment not carried out due to patient leaving prior to being seen by health care provider: Secondary | ICD-10-CM | POA: Insufficient documentation

## 2017-04-07 DIAGNOSIS — L03114 Cellulitis of left upper limb: Secondary | ICD-10-CM | POA: Diagnosis not present

## 2017-04-07 LAB — BASIC METABOLIC PANEL
Anion gap: 9 (ref 5–15)
BUN: 19 mg/dL (ref 6–20)
CO2: 29 mmol/L (ref 22–32)
Calcium: 9.1 mg/dL (ref 8.9–10.3)
Chloride: 99 mmol/L — ABNORMAL LOW (ref 101–111)
Creatinine, Ser: 0.92 mg/dL (ref 0.61–1.24)
GFR calc Af Amer: >60 mL/min (ref >60)
GFR calc non Af Amer: >60 mL/min (ref >60)
Glucose, Bld: 111 mg/dL — ABNORMAL HIGH (ref 65–99)
Potassium: 3.7 mmol/L (ref 3.5–5.1)
Sodium: 137 mmol/L (ref 135–145)

## 2017-04-07 LAB — CBC WITH DIFFERENTIAL/PLATELET
Basophils Absolute: 0.1 10*3/uL (ref 0.0–0.1)
Basophils Relative: 1 %
Eosinophils Absolute: 0.2 10*3/uL (ref 0.0–0.7)
Eosinophils Relative: 3 %
HCT: 39.3 % (ref 39.0–52.0)
Hemoglobin: 13.7 g/dL (ref 13.0–17.0)
Lymphocytes Relative: 33 %
Lymphs Abs: 2.7 10*3/uL (ref 0.7–4.0)
MCH: 31.5 pg (ref 26.0–34.0)
MCHC: 34.9 g/dL (ref 30.0–36.0)
MCV: 90.3 fL (ref 78.0–100.0)
Monocytes Absolute: 1 10*3/uL (ref 0.1–1.0)
Monocytes Relative: 12 %
Neutro Abs: 4.2 10*3/uL (ref 1.7–7.7)
Neutrophils Relative %: 51 %
Platelets: 185 10*3/uL (ref 150–400)
RBC: 4.35 MIL/uL (ref 4.22–5.81)
RDW: 12.6 % (ref 11.5–15.5)
WBC: 8.1 10*3/uL (ref 4.0–10.5)

## 2017-04-07 LAB — SEDIMENTATION RATE: Sed Rate: 48 mm/hr — ABNORMAL HIGH (ref 0–16)

## 2017-04-07 LAB — I-STAT CG4 LACTIC ACID, ED: Lactic Acid, Venous: 1.59 mmol/L (ref 0.5–1.9)

## 2017-04-07 LAB — C-REACTIVE PROTEIN: CRP: 1.5 mg/dL — ABNORMAL HIGH (ref <1.0)

## 2017-04-07 MED ORDER — VANCOMYCIN HCL 10 G IV SOLR
1500.0000 mg | Freq: Once | INTRAVENOUS | Status: DC
  Filled 2017-04-07: qty 1500

## 2017-04-07 MED ORDER — CLINDAMYCIN HCL 300 MG PO CAPS: 300.0000 mg | ORAL_CAPSULE | Freq: Three times a day (TID) | ORAL | 0 refills | Status: DC

## 2017-04-07 MED ORDER — OXYCODONE-ACETAMINOPHEN 5-325 MG PO TABS
2.0000 | ORAL_TABLET | Freq: Once | ORAL | Status: AC
  Administered 2017-04-07: 2 via ORAL
  Filled 2017-04-07: qty 2
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
## 2017-04-07 NOTE — ED Notes (Signed)
Pt refuses medications.

## 2017-04-07 NOTE — ED Notes (Signed)
Pt signing out AMA, Dr Manus Gunningancour spoke with pt.
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
## 2017-04-12 LAB — CULTURE, BLOOD (ROUTINE X 2)
Culture: NO GROWTH
Culture: NO GROWTH
Special Requests: ADEQUATE
Special Requests: ADEQUATE

## 2020-10-31 ENCOUNTER — Emergency Department (HOSPITAL_COMMUNITY)
Admission: EM | Admit: 2020-10-31 | Discharge: 2020-11-01 | Disposition: A | Discharge status: Home / Self Care | Payer: Medicaid Other | Attending: Emergency Medicine | Admitting: Emergency Medicine

## 2020-10-31 ENCOUNTER — Other Ambulatory Visit: Payer: Self-pay

## 2020-10-31 ENCOUNTER — Encounter (HOSPITAL_COMMUNITY): Payer: Self-pay | Admitting: Emergency Medicine

## 2020-10-31 DIAGNOSIS — K4090 Unilateral inguinal hernia, without obstruction or gangrene, not specified as recurrent: Secondary | ICD-10-CM | POA: Diagnosis not present

## 2020-10-31 DIAGNOSIS — F17210 Nicotine dependence, cigarettes, uncomplicated: Secondary | ICD-10-CM | POA: Diagnosis not present

## 2020-10-31 DIAGNOSIS — R1031 Right lower quadrant pain: Secondary | ICD-10-CM | POA: Diagnosis present

## 2020-10-31 NOTE — ED Triage Notes (Signed)
Pt states he has been having lower right abd pain on and off for several days now. Pt states knot came up a few days ago and hasn't went away. Pt states pain starts to get worse after working.

## 2020-11-01 ENCOUNTER — Emergency Department (HOSPITAL_COMMUNITY): Payer: Medicaid Other

## 2020-11-01 LAB — BASIC METABOLIC PANEL
Anion gap: 7 (ref 5–15)
BUN: 15 mg/dL (ref 6–20)
CO2: 30 mmol/L (ref 22–32)
Calcium: 9.2 mg/dL (ref 8.9–10.3)
Chloride: 100 mmol/L (ref 98–111)
Creatinine, Ser: 1.02 mg/dL (ref 0.61–1.24)
GFR, Estimated: >60 mL/min (ref >60)
Glucose, Bld: 102 mg/dL — ABNORMAL HIGH (ref 70–99)
Potassium: 4 mmol/L (ref 3.5–5.1)
Sodium: 137 mmol/L (ref 135–145)

## 2020-11-01 LAB — CBC WITH DIFFERENTIAL/PLATELET
Abs Immature Granulocytes: 0.02 10*3/uL (ref 0.00–0.07)
Basophils Absolute: 0 10*3/uL (ref 0.0–0.1)
Basophils Relative: 1 %
Eosinophils Absolute: 0.2 10*3/uL (ref 0.0–0.5)
Eosinophils Relative: 3 %
HCT: 40.5 % (ref 39.0–52.0)
Hemoglobin: 13.6 g/dL (ref 13.0–17.0)
Immature Granulocytes: 0 %
Lymphocytes Relative: 32 %
Lymphs Abs: 2.2 10*3/uL (ref 0.7–4.0)
MCH: 30.8 pg (ref 26.0–34.0)
MCHC: 33.6 g/dL (ref 30.0–36.0)
MCV: 91.6 fL (ref 80.0–100.0)
Monocytes Absolute: 0.9 10*3/uL (ref 0.1–1.0)
Monocytes Relative: 13 %
Neutro Abs: 3.6 10*3/uL (ref 1.7–7.7)
Neutrophils Relative %: 51 %
Platelets: 272 10*3/uL (ref 150–400)
RBC: 4.42 MIL/uL (ref 4.22–5.81)
RDW: 12.9 % (ref 11.5–15.5)
WBC: 7 10*3/uL (ref 4.0–10.5)
nRBC: 0 % (ref 0.0–0.2)

## 2020-11-01 MED ORDER — MORPHINE SULFATE (PF) 4 MG/ML IV SOLN
4.0000 mg | Freq: Once | INTRAVENOUS | Status: AC
  Administered 2020-11-01: 4 mg via INTRAVENOUS
  Filled 2020-11-01: qty 1

## 2020-11-01 MED ORDER — IOHEXOL 9 MG/ML PO SOLN
ORAL | Status: AC
  Filled 2020-11-01: qty 1000

## 2020-11-01 MED ORDER — ONDANSETRON HCL 4 MG/2ML IJ SOLN
4.0000 mg | Freq: Once | INTRAMUSCULAR | Status: AC
  Administered 2020-11-01: 4 mg via INTRAVENOUS
  Filled 2020-11-01: qty 2

## 2020-11-01 NOTE — ED Provider Notes (Signed)
[facility] EMERGENCY DEPARTMENT Provider Note   CSN: [id number] Arrival date & time: 10/31/20  2319     History Chief Complaint  Patient presents with  . Abdominal Pain    [name] [name] is a 45 y.o. male.  HPI     This 45 year old male with a history of hepatitis C who presents with right lower quadrant abdominal pain.  Patient reports he first noted some right lower quadrant abdominal pain and bulging after having sex several weeks ago.  Since that time he has had intermittent pain.  He reports persistent bulge in the right lower quadrant which is worse when he has a bowel movement.  He was at work tonight and had acute onset of worsening pain.  No nausea or vomiting.  He reports normal bowel movements.  He rates his pain at 10 out of 10.  He states at times he has noted swelling into his right scrotum.  Denies dysuria or penile discharge.  No known sexual contacts.  Currently he rates his pain 8 out of 10.  He has not taken anything for the pain. Past Medical History:  Diagnosis Date  . Hep C w/o coma, chronic ([facility])   . Renal disorder     Patient Active Problem List   Diagnosis Date Noted  . Abscess 04/07/2017    Past Surgical History:  Procedure Laterality Date  . HAND SURGERY    . HAND SURGERY         Family History  Problem Relation Age of Onset  . Diabetes Mother   . Diabetes Father     Social History   Tobacco Use  . Smoking status: Current Every Day Smoker    Packs/day: 1.00    Types: Cigarettes  . Smokeless tobacco: Never Used  Substance Use Topics  . Alcohol use: No    Comment: rarely  . Drug use: No    Home Medications Prior to Admission medications   Medication Sig Start Date End Date Taking? Authorizing Provider  Buprenorphine HCl-Naloxone HCl (SUBOXONE) 8-2 MG FILM Place 1 Film under the tongue 2 (two) times daily.    [provider]  clindamycin (CLEOCIN) 300 MG capsule Take 1 capsule (300 mg total) by mouth 3 (three) times  daily. 04/07/17   Rancour, Jeannett Senior, MD  gabapentin (NEURONTIN) 300 MG capsule Take 300 mg by mouth 6 (six) times daily.    [provider]  ibuprofen (ADVIL,MOTRIN) 600 MG tablet Take 1 tablet (600 mg total) by mouth 4 (four) times daily. 01/30/17   Ivery Quale, PA-C    Allergies    Tramadol and Biaxin [clarithromycin]  Review of Systems   Review of Systems  Constitutional: Negative for fever.  Respiratory: Negative for shortness of breath.   Cardiovascular: Negative for chest pain.  Gastrointestinal: Positive for abdominal pain. Negative for diarrhea, nausea and vomiting.  Genitourinary: Positive for scrotal swelling.  All other systems reviewed and are negative.   Physical Exam Updated Vital Signs BP 111/84   Pulse 65   Temp 98.4 F (36.9 C) (Oral)   Resp 18   Ht 1.803 m (5\' 11" )   Wt 74.8 kg   SpO2 100%   BMI 23.01 kg/m   Physical Exam Vitals and nursing note reviewed.  Constitutional:      Appearance: He is well-developed. He is not ill-appearing.  HENT:     Head: Normocephalic and atraumatic.  Eyes:     Pupils: Pupils are equal, round, and reactive to light.  Cardiovascular:     Rate and Rhythm: Normal rate and regular rhythm.     Heart sounds: Normal heart sounds. No murmur heard.   Pulmonary:     Effort: Pulmonary effort is normal. No respiratory distress.     Breath sounds: Normal breath sounds. No wheezing.  Abdominal:     General: Bowel sounds are normal.     Palpations: Abdomen is soft.     Tenderness: There is no abdominal tenderness. There is no rebound.     Hernia: A hernia is present. Hernia is present in the right inguinal area.  Musculoskeletal:     Cervical back: Neck supple.  Lymphadenopathy:     Cervical: No cervical adenopathy.  Skin:    General: Skin is warm and dry.  Neurological:     Mental Status: He is alert and oriented to person, place, and time.  Psychiatric:        Mood and Affect: Mood normal.     ED Results /  Procedures / Treatments   Labs (all labs ordered are listed, but only abnormal results are displayed) Labs Reviewed  BASIC METABOLIC PANEL - Abnormal; Notable for the following components:      Result Value   Glucose, Bld 102 (*)    All other components within normal limits  CBC WITH DIFFERENTIAL/PLATELET    EKG None  Radiology CT ABDOMEN PELVIS WO CONTRAST  Result Date: 11/01/2020 CLINICAL DATA:  Abdominal pain, hernia suspected EXAM: CT ABDOMEN AND PELVIS WITHOUT CONTRAST TECHNIQUE: Multidetector CT imaging of the abdomen and pelvis was performed following the standard protocol without IV contrast. COMPARISON:  January 04, 2015. FINDINGS: Lower chest: No acute abnormality. Hepatobiliary: Unremarkable noncontrast appearance of the hepatic parenchyma. Gallbladder is unremarkable. No biliary ductal dilation. Pancreas: Within normal limits. Spleen: Within normal limits. Adrenals/Urinary Tract: Adrenal glands are unremarkable. Kidneys are normal, without renal calculi, contour deforming lesion, or hydronephrosis. Bladder is unremarkable for degree of distension. Stomach/Bowel: Stomach is grossly unremarkable. No pathologic dilation of small bowel. The appendix is not definitely visualized however there is no pericecal inflammation. Radiopaque enteric contrast visualized to the level of the distal small bowel. Moderate volume of formed stool throughout the colon. Vascular/Lymphatic: No significant vascular findings are present. No pathologically enlarged abdominal or pelvic lymph nodes. Reproductive: Prostate is unremarkable. Other: Small fat containing right inguinal hernia. No abdominopelvic ascites. Musculoskeletal: No acute osseous abnormality. IMPRESSION: 1. Small fat containing right inguinal hernia. 2. Moderate volume of formed stool throughout the colon, suggestive of constipation. Electronically Signed   By: Maudry Mayhew MD   On: 11/01/2020 03:04    Procedures Procedures   Medications  Ordered in ED Medications  iohexol (OMNIPAQUE) 9 MG/ML oral solution (has no administration in time range)  morphine 4 MG/ML injection 4 mg (4 mg Intravenous Given 11/01/20 0021)  ondansetron (ZOFRAN) injection 4 mg (4 mg Intravenous Given 11/01/20 0021)  morphine 4 MG/ML injection 4 mg (4 mg Intravenous Given 11/01/20 0227)    ED Course  I have reviewed the triage vital signs and the nursing notes.  Pertinent labs & imaging results that were available during my care of the patient were reviewed by me and considered in my medical decision making (see chart for details).    MDM Rules/Calculators/A&P                          Patient presents with right lower quadrant pain and bulging.  He is nontoxic and  vital signs are reassuring.  Clinical history and exam is most consistent with a hernia.  It appears reducible on exam.  However, patient has significant tenderness.  He has no obstructive symptoms including vomiting.  He reports bowel movements at home.  Labs obtained and largely reassuring.  No significant metabolic derangements.  Patient had ongoing pain.  For this reason, CT abdomen pelvis was obtained to rule out any incarceration or strangulation.  CT scan shows a fat-containing inguinal hernia.  Patient will be given general surgery follow-up.  After history, exam, and medical workup I feel the patient has been appropriately medically screened and is safe for discharge home. Pertinent diagnoses were discussed with the patient. Patient was given return precautions.  Final Clinical Impression(s) / ED Diagnoses Final diagnoses:  Unilateral inguinal hernia without obstruction or gangrene, recurrence not specified    Rx / DC Orders ED Discharge Orders    None       Shon Baton, MD 11/01/20 [phone]

## 2020-11-01 NOTE — Discharge Instructions (Addendum)
You were seen today and found to have a hernia.  Follow-up with general surgery if you continue to have pain.  Avoid situations where you are increasing your intra-abdominal pressure.  Make sure that you are having soft stools and avoid heavy lifting.

## 2020-12-05 ENCOUNTER — Encounter (HOSPITAL_COMMUNITY): Payer: Self-pay | Admitting: Emergency Medicine

## 2020-12-05 ENCOUNTER — Other Ambulatory Visit: Payer: Self-pay

## 2020-12-05 ENCOUNTER — Emergency Department (HOSPITAL_COMMUNITY)
Admission: EM | Admit: 2020-12-05 | Discharge: 2020-12-05 | Disposition: A | Discharge status: Home / Self Care | Payer: Medicaid Other | Attending: Emergency Medicine | Admitting: Emergency Medicine

## 2020-12-05 DIAGNOSIS — K469 Unspecified abdominal hernia without obstruction or gangrene: Secondary | ICD-10-CM | POA: Diagnosis not present

## 2020-12-05 DIAGNOSIS — L0231 Cutaneous abscess of buttock: Secondary | ICD-10-CM | POA: Insufficient documentation

## 2020-12-05 DIAGNOSIS — R109 Unspecified abdominal pain: Secondary | ICD-10-CM | POA: Diagnosis present

## 2020-12-05 DIAGNOSIS — Z5321 Procedure and treatment not carried out due to patient leaving prior to being seen by health care provider: Secondary | ICD-10-CM | POA: Diagnosis not present

## 2020-12-05 DIAGNOSIS — L02414 Cutaneous abscess of left upper limb: Secondary | ICD-10-CM | POA: Insufficient documentation

## 2020-12-05 NOTE — ED Triage Notes (Signed)
Pt c/o hernia pain to the right side of abd x one month. Pt also c/o abscesses to the right buttocks and left arm pit area.

## 2021-02-28 ENCOUNTER — Emergency Department (HOSPITAL_COMMUNITY)
Admission: EM | Admit: 2021-02-28 | Discharge: 2021-02-28 | Disposition: A | Discharge status: Home / Self Care | Payer: Medicaid Other | Attending: Emergency Medicine | Admitting: Emergency Medicine

## 2021-02-28 ENCOUNTER — Other Ambulatory Visit: Payer: Self-pay

## 2021-02-28 ENCOUNTER — Encounter (HOSPITAL_COMMUNITY): Payer: Self-pay | Admitting: *Deleted

## 2021-02-28 DIAGNOSIS — R309 Painful micturition, unspecified: Secondary | ICD-10-CM | POA: Diagnosis not present

## 2021-02-28 DIAGNOSIS — F17210 Nicotine dependence, cigarettes, uncomplicated: Secondary | ICD-10-CM | POA: Insufficient documentation

## 2021-02-28 DIAGNOSIS — K4091 Unilateral inguinal hernia, without obstruction or gangrene, recurrent: Secondary | ICD-10-CM

## 2021-02-28 DIAGNOSIS — Z791 Long term (current) use of non-steroidal anti-inflammatories (NSAID): Secondary | ICD-10-CM | POA: Insufficient documentation

## 2021-02-28 DIAGNOSIS — K4020 Bilateral inguinal hernia, without obstruction or gangrene, not specified as recurrent: Secondary | ICD-10-CM | POA: Diagnosis not present

## 2021-02-28 MED ORDER — OXYCODONE-ACETAMINOPHEN 5-325 MG PO TABS: 1.0000 | ORAL_TABLET | Freq: Four times a day (QID) | ORAL | 0 refills | Status: DC | PRN

## 2021-02-28 MED ORDER — OXYCODONE-ACETAMINOPHEN 5-325 MG PO TABS
1.0000 | ORAL_TABLET | Freq: Once | ORAL | Status: AC
  Administered 2021-02-28: 1 via ORAL
  Filled 2021-02-28: qty 1

## 2021-02-28 NOTE — Discharge Instructions (Addendum)
Your inguinal hernia easily reduces when you lie flat.  I recommend that you wear some type of hernia support.  Avoid squatting, heavy lifting or straining to have a bowel movement.  It is very important that you take stool softeners to keep your stools loose.  Call the general surgeon listed to arrange a follow-up appointment.  Return to emergency department for any new or worsening symptoms.

## 2021-02-28 NOTE — ED Provider Notes (Signed)
[facility] EMERGENCY DEPARTMENT Provider Note   CSN: [id number] Arrival date & time: 02/28/21  1511     History No chief complaint on file.   [name] [name] is a 45 y.o. male.  HPI     [name] [name] is a 45 y.o. male who presents to the Emergency Department complaining of pain and right groin swelling for 2 days.  He states that he has a hernia at his right groin that has been there for some time.  2 days ago, he was lifting a heavy sofa and felt sharp pain to his groin.  States that he is able to lie down and push on the area and the hernia resolves but still feels tender.  He denies any redness, fever, chills, nausea or vomiting.  He states that he is having to apply pressure to the area to urinate or defecate and is having some difficulty with urinating due to pain.  He denies any abdominal pain, swelling or redness of his scrotum or pain of his testicles.  He was seen here in May of this year and had CT of the abdomen and pelvis that showed a small fat-containing right inguinal hernia.   Past Medical History:  Diagnosis Date   Hep C w/o coma, chronic ([facility])    Renal disorder     Patient Active Problem List   Diagnosis Date Noted   Abscess 04/07/2017    Past Surgical History:  Procedure Laterality Date   HAND SURGERY     HAND SURGERY         Family History  Problem Relation Age of Onset   Diabetes Mother    Diabetes Father     Social History   Tobacco Use   Smoking status: Every Day    Packs/day: 1.00    Types: Cigarettes   Smokeless tobacco: Never  Substance Use Topics   Alcohol use: No    Comment: rarely   Drug use: No    Home Medications Prior to Admission medications   Medication Sig Start Date End Date Taking? Authorizing Provider  Buprenorphine HCl-Naloxone HCl (SUBOXONE) 8-2 MG FILM Place 1 Film under the tongue 2 (two) times daily.    [provider]  clindamycin (CLEOCIN) 300 MG capsule Take 1 capsule (300 mg total) by mouth 3  (three) times daily. 04/07/17   Rancour, Jeannett Senior, MD  gabapentin (NEURONTIN) 300 MG capsule Take 300 mg by mouth 6 (six) times daily.    [provider]  ibuprofen (ADVIL,MOTRIN) 600 MG tablet Take 1 tablet (600 mg total) by mouth 4 (four) times daily. 01/30/17   Ivery Quale, PA-C    Allergies    Tramadol and Biaxin [clarithromycin]  Review of Systems   Review of Systems  Constitutional:  Negative for chills, fatigue and fever.  HENT:  Negative for sore throat and trouble swallowing.   Respiratory:  Negative for cough, shortness of breath and wheezing.   Cardiovascular:  Negative for chest pain and palpitations.  Gastrointestinal:  Negative for abdominal pain, nausea and vomiting.  Genitourinary:  Negative for dysuria, flank pain, hematuria, penile swelling and scrotal swelling.       Right groin pain  Musculoskeletal:  Negative for arthralgias, back pain, myalgias, neck pain and neck stiffness.  Skin:  Negative for rash.  Neurological:  Negative for dizziness, weakness and numbness.  Hematological:  Does not bruise/bleed easily.   Physical Exam Updated Vital Signs BP (!) 131/98 (BP Location: Left Arm)   Pulse 79  Temp 98.3 F (36.8 C) (Oral)   Resp 16   SpO2 98%   Physical Exam Vitals and nursing note reviewed. Exam conducted with a chaperone present.  Constitutional:      General: He is not in acute distress.    Appearance: Normal appearance. He is not ill-appearing or toxic-appearing.  Cardiovascular:     Rate and Rhythm: Normal rate and regular rhythm.     Pulses: Normal pulses.  Pulmonary:     Effort: Pulmonary effort is normal.     Breath sounds: Normal breath sounds. No wheezing.  Abdominal:     Palpations: Abdomen is soft.     Tenderness: There is no abdominal tenderness. There is no guarding or rebound.     Hernia: A hernia is present. Hernia is present in the right inguinal area.  Genitourinary:    Penis: Circumcised. No tenderness or swelling.       Testes:        Right: Tenderness or swelling not present.  Musculoskeletal:        General: Normal range of motion.  Lymphadenopathy:     Lower Body: No right inguinal adenopathy.  Skin:    General: Skin is warm and dry.     Findings: No rash.  Neurological:     Mental Status: He is alert and oriented to person, place, and time.    ED Results / Procedures / Treatments   Labs (all labs ordered are listed, but only abnormal results are displayed) Labs Reviewed - No data to display  EKG None  Radiology No results found.  Procedures Procedures   Medications Ordered in ED Medications - No data to display  ED Course  I have reviewed the triage vital signs and the nursing notes.  Pertinent labs & imaging results that were available during my care of the patient were reviewed by me and considered in my medical decision making (see chart for details).    MDM Rules/Calculators/A&P                           Patient here for right groin pain with known inguinal hernia.  Area became more painful after lifting a sofa 2 days ago.  Patient endorses tenderness of the area with pain associated with urination and defecation.  States hernia is resolved when pressed upon and in the supine position.  No vomiting, abdominal pain, fever or chills.  No swelling of his testicles or scrotum.  On my exam, nursing present in the room during exam.  Tender right inguinal mass that is easily reduced with patient in supine position and mild pressure applied. Patient complaining of urinary hesitancy.  Will obtain post void bladder scan.  Vital signs are reassuring.  Patient nontoxic-appearing, requesting pain control.  He has a documented inguinal hernia on the right.  Prior medical records reviewed by me.  No indication for repeat imaging this evening as hernia is easily reduced.  No clinical concern for incarceration.  Patient had post void residual of 35 cc by bladder scan.  Discussed importance of  close follow-up with general surgery.  Patient's pain addressed here, database was reviewed no recent narcotic prescriptions on file.  I feel that patient is appropriate for discharge home, he is agreeable to follow-up with general surgery.  Will provide short course of pain medication.  Return precautions were discussed.     Final Clinical Impression(s) / ED Diagnoses Final diagnoses:  Unilateral recurrent inguinal hernia  without obstruction or gangrene    Rx / DC Orders ED Discharge Orders     None        Pauline Aus, PA-C 02/28/21 1957    Vanetta Mulders, MD 03/08/21 [phone]

## 2021-02-28 NOTE — ED Notes (Signed)
Bladder scan completed. 36ml largest amount seen. Pt stated that he voided about 10 min before I came to do the bladder scan.

## 2021-02-28 NOTE — ED Triage Notes (Signed)
Pain in right lower groin area, history of hernia

## 2021-03-07 ENCOUNTER — Telehealth: Payer: Self-pay

## 2021-03-07 NOTE — Telephone Encounter (Signed)
Patient left message on machine about being a new patient referred for his hernia and requesting pain medicine. He was referred to general surgery for evaluation. Left return VM advising him that his 03/21/21 appt was with Dr. Henreitta Leber and he should give them a call.

## 2021-03-21 ENCOUNTER — Ambulatory Visit: Payer: Medicaid Other | Admitting: General Surgery

## 2021-03-28 ENCOUNTER — Ambulatory Visit: Payer: Medicaid Other | Admitting: General Surgery

## 2021-04-04 ENCOUNTER — Ambulatory Visit: Payer: Medicaid Other | Admitting: General Surgery

## 2021-05-07 ENCOUNTER — Ambulatory Visit: Payer: Medicaid Other | Admitting: General Surgery

## 2021-06-05 ENCOUNTER — Emergency Department (HOSPITAL_COMMUNITY)
Admission: EM | Admit: 2021-06-05 | Discharge: 2021-06-05 | Discharge status: Against Medical Advice | Payer: Medicaid Other

## 2021-06-05 NOTE — ED Triage Notes (Signed)
Pt arrived via [facility] c/o mid abdominal pain that began apprx 3 hrs ago. Pt reports having an appointment in a week on Dec 28th to have a right inguinal hernia evaluated. Pt endorses nausea without emesis. Pt ambulatory to Triage.

## 2021-06-13 ENCOUNTER — Encounter: Payer: Self-pay | Admitting: General Surgery

## 2021-06-13 ENCOUNTER — Other Ambulatory Visit: Payer: Self-pay

## 2021-06-13 ENCOUNTER — Ambulatory Visit (INDEPENDENT_AMBULATORY_CARE_PROVIDER_SITE_OTHER): Payer: Medicaid Other | Admitting: General Surgery

## 2021-06-13 VITALS — BP 103/65 | HR 66 | Temp 97.9°F | Resp 16 | Ht 71.0 in | Wt 165.0 lb

## 2021-06-13 DIAGNOSIS — K4090 Unilateral inguinal hernia, without obstruction or gangrene, not specified as recurrent: Secondary | ICD-10-CM | POA: Diagnosis not present

## 2021-06-13 NOTE — Patient Instructions (Signed)
Open Hernia Repair, Adult °Open hernia repair is a surgical procedure to fix a hernia. A hernia occurs when an internal organ or tissue pushes through a weak spot in the muscles along the wall of the abdomen. Hernias commonly occur in the groin and around the belly button. °Most hernias tend to get worse over time. Often, surgery is done to prevent the hernia from becoming bigger, uncomfortable, or an emergency. Emergency surgery Barkdull be needed if contents of the abdomen get stuck in the opening (incarcerated hernia) or if the blood supply gets cut off (strangulated hernia). In an open repair, an incision is made in the abdomen to perform the surgery. °Tell a health care provider about: °Any allergies you have. °All medicines you are taking, including vitamins, herbs, eye drops, creams, and over-the-counter medicines. °Any problems you or family members have had with anesthetic medicines. °Any blood or bone disorders you have. °Any surgeries you have had. °Any medical conditions you have, including any recent cold or flu (influenza)symptoms. °Whether you are pregnant or Greulich be pregnant. °What are the risks? °Generally, this is a safe procedure. However, problems Bourque occur, including: °Long-lasting (chronic) pain. °Bleeding. °Infection. °Damage to the testicles. This can cause shrinking or swelling. °Damage to nearby structures or organs, including the bladder, blood vessels, intestines, or nerves near the hernia. °Blood clots. °Trouble passing urine. °Return of the hernia. °What happens before the procedure? °Medicines °Ask your health care provider about: °Changing or stopping your regular medicines. This is especially important if you are taking diabetes medicines or blood thinners. °Taking medicines such as aspirin and ibuprofen. These medicines can thin your blood. Do not take these medicines unless your health care provider tells you to take them. °Taking over-the-counter medicines, vitamins, herbs, and  supplements. °Surgery safety °Ask your health care provider: °How your surgery site will be marked. °What steps will be taken to help prevent infection. These steps Floren include: °Removing hair at the surgery site. °Washing skin with a germ-killing soap. °Receiving antibiotic medicine. °General instructions °You Scialdone have an exam or testing, such as blood tests or imaging studies. °Do not use any products that contain nicotine or tobacco for at least 4 weeks before the procedure. These products include cigarettes, chewing tobacco, and vaping devices, such as e-cigarettes. If you need help quitting, ask your health care provider. °Let your health care provider know if you develop a cold or any infection before your surgery. If you get an infection before surgery, you [place] receive antibiotics to treat it. °Plan to have a responsible adult take you home from the hospital or clinic. °If you will be going home right after the procedure, plan to have a responsible adult care for you for the time you are told. This is important. °What happens during the procedure? ° °An IV will be inserted into one of your veins. °You will be given one or more of the following: °A medicine to help you relax (sedative). °A medicine to numb the area (local anesthetic). °A medicine to make you fall asleep (general anesthetic). °Your surgeon will make an incision over the hernia. °The tissues of the hernia will be moved back into place. °The edges of the hernia Ocain be stitched (sutured) together. °The opening in the abdominal muscles will be closed with stitches (sutures). Or, your surgeon will place a mesh patch made of artificial (synthetic) material over the opening. °The incision will be closed with sutures, skin glue, or adhesive strips. °A bandage (dressing) Dhanani be   placed over the incision. The procedure may vary among health care providers and hospitals. What happens after the procedure? Your blood pressure, heart rate, breathing rate,  and blood oxygen level will be monitored until you leave the hospital or clinic. You may be given medicine for pain. If you were given a sedative during the procedure, it can affect you for several hours. Do not drive or operate machinery until your health care provider says that it is safe. Summary Open hernia repair is a surgical procedure to fix a hernia. Hernias commonly occur in the groin and around the belly button. Emergency surgery may be needed if contents of the abdomen get stuck in the opening (incarcerated hernia) or if the blood supply gets cut off (strangulated hernia). In this procedure, an incision is made in the abdomen to perform the surgery. After the procedure, you may be given medicine for pain. This information is not intended to replace advice given to you by your health care provider. Make sure you discuss any questions you have with your health care provider.  Inguinal Hernia, Adult An inguinal hernia develops when fat or the intestines push through a weak spot in a muscle where the leg meets the lower abdomen (groin). This creates a bulge. This kind of hernia could also be: In the scrotum, if you are male. In folds of skin around the vagina, if you are male. There are three types of inguinal hernias: Hernias that can be pushed back into the abdomen (are reducible). This type rarely causes pain. Hernias that are not reducible (are incarcerated). Hernias that are not reducible and lose their blood supply (are strangulated). This type of hernia requires emergency surgery. What are the causes? This condition is caused by having a weak spot in the muscles or tissues in your groin. This develops over time. The hernia may poke through the weak spot when you suddenly strain your lower abdominal muscles, such as when you: Lift a heavy object. Strain to have a bowel movement. Constipation can lead to straining. Cough. What increases the risk? This condition is more likely to  develop in: Males. Pregnant females. People who: Are overweight. Work in jobs that require long periods of standing or heavy lifting. Have had an inguinal hernia before. Smoke or have lung disease. These factors can lead to long-term (chronic) coughing. What are the signs or symptoms? Symptoms may depend on the size of the hernia. Often, a small inguinal hernia has no symptoms. Symptoms of a larger hernia may include: A bulge in the groin area. This is easier to see when standing. It might not be visible when lying down. Pain or burning in the groin. This may get worse when lifting, straining, or coughing. A dull ache or a feeling of pressure in the groin. An unusual bulge in the scrotum, in males. Symptoms of a strangulated inguinal hernia may include: A bulge in your groin that is very painful and tender to the touch. A bulge that turns red or purple. Fever, nausea, and vomiting. Inability to have a bowel movement or to pass gas. How is this diagnosed? This condition is diagnosed based on your symptoms, your medical history, and a physical exam. Your health care provider may feel your groin area and ask you to cough. How is this treated? Treatment depends on the size of your hernia and whether you have symptoms. If you do not have symptoms, your health care provider may have you watch your hernia carefully and have you  come in for follow-up visits. If your hernia is large or if you have symptoms, you may need surgery to repair the hernia. Follow these instructions at home: Lifestyle Avoid lifting heavy objects. Avoid standing for long periods of time. Do not use any products that contain nicotine or tobacco. These products include cigarettes, chewing tobacco, and vaping devices, such as e-cigarettes. If you need help quitting, ask your health care provider. Maintain a healthy weight. Preventing constipation You may need to take these actions to prevent or treat constipation: Drink  enough fluid to keep your urine pale yellow. Take over-the-counter or prescription medicines. Eat foods that are high in fiber, such as beans, whole grains, and fresh fruits and vegetables. Limit foods that are high in fat and processed sugars, such as fried or sweet foods. General instructions You may try to push the hernia back in place by very gently pressing on it while lying down. Do not try to force the bulge back in if it will not push in easily. Watch your hernia for any changes in shape, size, or color. Get help right away if you notice any changes. Take over-the-counter and prescription medicines only as told by your health care provider. Keep all follow-up visits. This is important. Contact a health care provider if: You have a fever or chills. You develop new symptoms. Your symptoms get worse. Get help right away if: You have pain in your groin that suddenly gets worse. You have a bulge in your groin that: Suddenly gets bigger and does not get smaller. Becomes red or purple or painful to the touch. You are a man and you have a sudden pain in your scrotum, or the size of your scrotum suddenly changes. You cannot push the hernia back in place by very gently pressing on it when you are lying down. You have nausea or vomiting that does not go away. You have a fast heartbeat. You cannot have a bowel movement or pass gas. These symptoms may represent a serious problem that is an emergency. Do not wait to see if the symptoms will go away. Get medical help right away. Call your local emergency services (911 in the [place] Summary An inguinal hernia develops when fat or the intestines push through a weak spot in a muscle where your leg meets your lower abdomen (groin). This condition is caused by having a weak spot in muscles or tissues in your groin. Symptoms may depend on the size of the hernia, and they may include pain or swelling in your groin. A small inguinal hernia often has no  symptoms. Treatment may not be needed if you do not have symptoms. If you have symptoms or a large hernia, you may need surgery to repair the hernia. Avoid lifting heavy objects. Also, avoid standing for long periods of time. This information is not intended to replace advice given to you by your health care provider. Make sure you discuss any questions you have with your health care provider. Document Revised: 01/31/2020 Document Reviewed: 01/31/2020 Elsevier Patient Education  2022 [employer] Inc.  Document Revised: 01/16/2020 Document Reviewed: 01/16/2020 Elsevier Patient Education  2022 [employer].

## 2021-06-13 NOTE — Progress Notes (Signed)
[facility] History and Physical  Reason for Referral: Right inguinal hernia  Referring Physician: ED   Chief Complaint   New Patient (Initial Visit)     [name] [name] is a 45 y.o. male.  HPI: [name] [name] is a 45 yo who has had a right inguinal hernia for a while and has been to the ED for it several times due to pain. The pain has been getting worse and he has a constant pulling and achy sensation in the right groin. The patient does have some nausea and no vomiting. He says that he does feel a bulge and has never noticed anything on the left side.   Past Medical History:  Diagnosis Date   Hep C w/o coma, chronic (HCC)    Renal disorder     Past Surgical History:  Procedure Laterality Date   HAND SURGERY     HAND SURGERY      Family History  Problem Relation Age of Onset   Diabetes Mother    Diabetes Father     Social History   Tobacco Use   Smoking status: Every Day    Packs/day: 1.00    Types: Cigarettes   Smokeless tobacco: Never  Substance Use Topics   Alcohol use: No    Comment: rarely   Drug use: No    Medications: I have reviewed the patient's current medications. Allergies as of 06/13/2021       Reactions   Tramadol Shortness Of Breath, Rash   Biaxin [clarithromycin] Hives        Medication List        Accurate as of June 13, 2021  3:08 PM. If you have any questions, ask your nurse or doctor.          STOP taking these medications    Buprenorphine HCl-Naloxone HCl 8-2 MG Film Stopped by: Lucretia Roers, MD   clindamycin 300 MG capsule Commonly known as: CLEOCIN Stopped by: Lucretia Roers, MD   gabapentin 300 MG capsule Commonly known as: NEURONTIN Stopped by: Lucretia Roers, MD   ibuprofen 600 MG tablet Commonly known as: ADVIL Stopped by: Lucretia Roers, MD   oxyCODONE-acetaminophen 5-325 MG tablet Commonly known as: PERCOCET/ROXICET Stopped by: Lucretia Roers, MD         ROS:  A  comprehensive review of systems was negative except for: Gastrointestinal: positive for abdominal pain, nausea, and reflux symptoms  Blood pressure 103/65, pulse 66, temperature 97.9 F (36.6 C), temperature source Oral, resp. rate 16, height 5\' 11"  (1.803 m), weight 165 lb (74.8 kg), SpO2 97 %. Physical Exam Vitals reviewed.  Constitutional:      Appearance: Normal appearance.  HENT:     Head: Normocephalic.     Nose: Nose normal.  Eyes:     Extraocular Movements: Extraocular movements intact.  Cardiovascular:     Rate and Rhythm: Normal rate and regular rhythm.  Pulmonary:     Effort: Pulmonary effort is normal.     Breath sounds: Normal breath sounds.  Abdominal:     General: There is no distension.     Palpations: Abdomen is soft.     Tenderness: There is abdominal tenderness.     Hernia: A hernia is present. Hernia is present in the right inguinal area. There is no hernia in the left inguinal area.     Comments: Reducible   Musculoskeletal:        General: Normal range of motion.  Cervical back: Normal range of motion.  Skin:    General: Skin is warm.  Neurological:     General: No focal deficit present.     Mental Status: He is alert and oriented to person, place, and time.    Results: CLINICAL DATA:  Abdominal pain, hernia suspected   EXAM: CT ABDOMEN AND PELVIS WITHOUT CONTRAST   TECHNIQUE: Multidetector CT imaging of the abdomen and pelvis was performed following the standard protocol without IV contrast.   COMPARISON:  January 04, 2015.   FINDINGS: Lower chest: No acute abnormality.   Hepatobiliary: Unremarkable noncontrast appearance of the hepatic parenchyma. Gallbladder is unremarkable. No biliary ductal dilation.   Pancreas: Within normal limits.   Spleen: Within normal limits.   Adrenals/Urinary Tract: Adrenal glands are unremarkable. Kidneys are normal, without renal calculi, contour deforming lesion, or hydronephrosis. Bladder is unremarkable  for degree of distension.   Stomach/Bowel: Stomach is grossly unremarkable. No pathologic dilation of small bowel. The appendix is not definitely visualized however there is no pericecal inflammation. Radiopaque enteric contrast visualized to the level of the distal small bowel. Moderate volume of formed stool throughout the colon.   Vascular/Lymphatic: No significant vascular findings are present. No pathologically enlarged abdominal or pelvic lymph nodes.   Reproductive: Prostate is unremarkable.   Other: Small fat containing right inguinal hernia. No abdominopelvic ascites.   Musculoskeletal: No acute osseous abnormality.   IMPRESSION: 1. Small fat containing right inguinal hernia. 2. Moderate volume of formed stool throughout the colon, suggestive of constipation.     Electronically Signed   By: Maudry Mayhew MD   On: 11/01/2020 03:04   Assessment & Plan:  [name] is a 45 y.o. male with a right inguinal hernia. He wants to proceed with repair.  Discussed the risk and benefits including, bleeding, infection, use of mesh, risk of recurrence, risk of nerve damage causing numbness or changes in sensation, risk of damage to the cord structures. The patient understands the risk and benefits of repair with mesh, and has decided to proceed.  We also discussed open versus laparoscopic surgery and the use of mesh. We discussed that I do open repairs with mesh, and that this is considered equivalent to laparoscopic surgery. We discussed reasons for opting for laparoscopic surgery including if a bilateral repair is needed or if a patient has a recurrence after an open repair.   All questions were answered to the satisfaction of the patient.    Lucretia Roers 06/13/2021, 3:08 PM

## 2021-06-17 NOTE — H&P (Signed)
[facility] History and Physical ° °Reason for Referral: Right inguinal hernia  °Referring Physician: ED  ° °Chief Complaint   °New Patient (Initial Visit) °  ° ° °[name] [name] is a 46 y.o. male.  °HPI: [name] [name] is a 46 yo who has had a right inguinal hernia for a while and has been to the ED for it several times due to pain. The pain has been getting worse and he has a constant pulling and achy sensation in the right groin. The patient does have some nausea and no vomiting. He says that he does feel a bulge and has never noticed anything on the left side.  ° °Past Medical History:  °Diagnosis Date  ° Hep C w/o coma, chronic (HCC)   ° Renal disorder   ° ° °Past Surgical History:  °Procedure Laterality Date  ° HAND SURGERY    ° HAND SURGERY    ° ° °Family History  °Problem Relation Age of Onset  ° Diabetes Mother   ° Diabetes Father   ° ° °Social History  ° °Tobacco Use  ° Smoking status: Every Day  °  Packs/day: 1.00  °  Types: Cigarettes  ° Smokeless tobacco: Never  °Substance Use Topics  ° Alcohol use: No  °  Comment: rarely  ° Drug use: No  ° ° °Medications: I have reviewed the patient's current medications. °Allergies as of 06/13/2021   ° °   Reactions  ° Tramadol Shortness Of Breath, Rash  ° Biaxin [clarithromycin] Hives  ° °  ° °  °Medication List  °  ° °  ° Accurate as of June 13, 2021  3:08 PM. If you have any questions, ask your nurse or doctor.  °  °  ° °  ° °STOP taking these medications   ° °Buprenorphine HCl-Naloxone HCl 8-2 MG Film °Stopped by: Alistair Senft C Teren Franckowiak, MD °  °clindamycin 300 MG capsule °Commonly known as: CLEOCIN °Stopped by: Rosalie Gelpi C Shion Bluestein, MD °  °gabapentin 300 MG capsule °Commonly known as: NEURONTIN °Stopped by: Kong Packett C Kentrell Hallahan, MD °  °ibuprofen 600 MG tablet °Commonly known as: ADVIL °Stopped by: Trigg Delarocha C Endre Coutts, MD °  °oxyCODONE-acetaminophen 5-325 MG tablet °Commonly known as: PERCOCET/ROXICET °Stopped by: Aubery Date C Tanairi Cypert, MD °  ° °  ° ° ° °ROS:  °A  comprehensive review of systems was negative except for: Gastrointestinal: positive for abdominal pain, nausea, and reflux symptoms ° °Blood pressure 103/65, pulse 66, temperature 97.9 °F (36.6 °C), temperature source Oral, resp. rate 16, height 5' 11" (1.803 m), weight 165 lb (74.8 kg), SpO2 97 %. °Physical Exam °Vitals reviewed.  °Constitutional:   °   Appearance: Normal appearance.  °HENT:  °   Head: Normocephalic.  °   Nose: Nose normal.  °Eyes:  °   Extraocular Movements: Extraocular movements intact.  °Cardiovascular:  °   Rate and Rhythm: Normal rate and regular rhythm.  °Pulmonary:  °   Effort: Pulmonary effort is normal.  °   Breath sounds: Normal breath sounds.  °Abdominal:  °   General: There is no distension.  °   Palpations: Abdomen is soft.  °   Tenderness: There is abdominal tenderness.  °   Hernia: A hernia is present. Hernia is present in the right inguinal area. There is no hernia in the left inguinal area.  °   Comments: Reducible   °Musculoskeletal:     °   General: Normal range of motion.  °     Cervical back: Normal range of motion.  °Skin: °   General: Skin is warm.  °Neurological:  °   General: No focal deficit present.  °   Mental Status: He is alert and oriented to person, place, and time.  ° ° °Results: °CLINICAL DATA:  Abdominal pain, hernia suspected °  °EXAM: °CT ABDOMEN AND PELVIS WITHOUT CONTRAST °  °TECHNIQUE: °Multidetector CT imaging of the abdomen and pelvis was performed °following the standard protocol without IV contrast. °  °COMPARISON:  January 04, 2015. °  °FINDINGS: °Lower chest: No acute abnormality. °  °Hepatobiliary: Unremarkable noncontrast appearance of the hepatic °parenchyma. Gallbladder is unremarkable. No biliary ductal dilation. °  °Pancreas: Within normal limits. °  °Spleen: Within normal limits. °  °Adrenals/Urinary Tract: Adrenal glands are unremarkable. Kidneys are °normal, without renal calculi, contour deforming lesion, or °hydronephrosis. Bladder is unremarkable  for degree of distension. °  °Stomach/Bowel: Stomach is grossly unremarkable. No pathologic °dilation of small bowel. The appendix is not definitely visualized °however there is no pericecal inflammation. Radiopaque enteric °contrast visualized to the level of the distal small bowel. Moderate °volume of formed stool throughout the colon. °  °Vascular/Lymphatic: No significant vascular findings are present. No °pathologically enlarged abdominal or pelvic lymph nodes. °  °Reproductive: Prostate is unremarkable. °  °Other: Small fat containing right inguinal hernia. No abdominopelvic °ascites. °  °Musculoskeletal: No acute osseous abnormality. °  °IMPRESSION: °1. Small fat containing right inguinal hernia. °2. Moderate volume of formed stool throughout the colon, suggestive °of constipation. °  °  °Electronically Signed °  By: Trase  Waltz MD °  On: 11/01/2020 03:04 ° ° °Assessment & Plan:  °[name] [name] is a 46 y.o. male with a right inguinal hernia. He wants to proceed with repair.  °Discussed the risk and benefits including, bleeding, infection, use of mesh, risk of recurrence, risk of nerve damage causing numbness or changes in sensation, risk of damage to the cord structures. The patient understands the risk and benefits of repair with mesh, and has decided to proceed.  We also discussed open versus laparoscopic surgery and the use of mesh. We discussed that I do open repairs with mesh, and that this is considered equivalent to laparoscopic surgery. We discussed reasons for opting for laparoscopic surgery including if a bilateral repair is needed or if a patient has a recurrence after an open repair. ° ° °All questions were answered to the satisfaction of the patient. ° ° ° °Donae Kueker C Pranish Akhavan °06/13/2021, 3:08 PM  ° ° ° ° ° °

## 2021-06-24 NOTE — Patient Instructions (Signed)
[name] [name]  06/24/2021     @PREFPERIOPPHARMACY @   Your procedure is scheduled on  06/28/2021.   Report to [facility] at  0730  A.M.   Call this number if you have problems the morning of surgery:  [phone]   Remember:  Do not eat or drink after midnight.      Take these medicines the morning of surgery with A SIP OF WATER                                             None    Do not wear jewelry, make-up or nail polish.  Do not wear lotions, powders, or perfumes, or deodorant.  Do not shave 48 hours prior to surgery.  Men may shave face and neck.  Do not bring valuables to the hospital.  [facility] belongings or valuables.  Contacts, dentures or bridgework may not be worn into surgery.  Leave your suitcase in the car.  After surgery it may be brought to your room.  For patients admitted to the hospital, discharge time will be determined by your treatment team.  Patients discharged the day of surgery will not be allowed to drive home and must have someone with them for 24 hours.    Special instructions:   DO NOT smoke tobacco or vape for 24 hours before your procedure.  Please read over the following fact sheets that you were given. Coughing and Deep Breathing, Surgical Site Infection Prevention, Anesthesia Post-op Instructions, and Care and Recovery After Surgery      Open Hernia Repair, Adult, Care After What can I expect after the procedure? After the procedure, it is common to have: Mild discomfort. Slight bruising. Mild swelling. Pain in the belly (abdomen). A small amount of blood from the cut from surgery (incision). Follow these instructions at home: Your doctor may give you more specific instructions. If you have problems, call your doctor. Medicines Take over-the-counter and prescription medicines only as told by your doctor. If told, take steps to prevent problems with pooping (constipation). You may need  to: Drink enough fluid to keep your pee (urine) pale yellow. Take medicines. You will be told what medicines to take. Eat foods that are high in fiber. These include beans, whole grains, and fresh fruits and vegetables. Limit foods that are high in fat and sugar. These include fried or sweet foods. Ask your doctor if you should avoid driving or using machines while you are taking your medicine. Incision care  Follow instructions from your doctor about how to take care of your incision. Make sure you: Wash your hands with soap and water for at least 20 seconds before and after you change your bandage (dressing). If you cannot use soap and water, use hand sanitizer. Change your bandage. Leave stitches or skin glue in place for at least 2 weeks. Leave tape strips alone unless you are told to take them off. You may trim the edges of the tape strips if they curl up. Check your incision every day for signs of infection. Check for: More redness, swelling, or pain. More fluid or blood. Warmth. Pus or a bad smell. Wear loose, soft clothing while your incision heals. Activity  Rest as told by your doctor. Do not lift anything that is heavier than 10 lb (  4.5 kg), or the limit that you are told. Do not play contact sports until your doctor says that this is safe. If you were given a sedative during your procedure, do not drive or use machines until your doctor says that it is safe. A sedative is a medicine that helps you relax. Return to your normal activities when your doctor says that it is safe. General instructions Do not take baths, swim, or use a hot tub. Ask your doctor about taking showers or sponge baths. Hold a pillow over your belly when you cough or sneeze. This helps with pain. Do not smoke or use any products that contain nicotine or tobacco. If you need help quitting, ask your doctor. Keep all follow-up visits. Contact a doctor if: You have any of these signs of infection in or  around your incision: More redness, swelling, or pain. More fluid or blood. Warmth. Pus. A bad smell. You have a fever or chills. You have blood in your poop (stool). You have not pooped (had a bowel movement) in 2-3 days. Medicine does not help your pain. Get help right away if: You have chest pain, or you are short of breath. You feel faint or light-headed. You have very bad pain. You vomit and your pain is worse. You have pain, swelling, or redness in a leg. These symptoms may be an emergency. Get help right away. Call your local emergency services (911 in the [place] Do not wait to see if the symptoms will go away. Do not drive yourself to the hospital. Summary After this procedure, it is common to have mild discomfort, slight bruising, and mild swelling. Follow instructions from your doctor about how to take care of your cut from surgery (incision). Check every day for signs of infection. Do not lift heavy objects or play contact sports until your doctor says it is safe. Return to your normal activities as told by your doctor. This information is not intended to replace advice given to you by your health care provider. Make sure you discuss any questions you have with your health care provider. Document Revised: 01/16/2020 Document Reviewed: 01/16/2020 Elsevier Patient Education  2022 [employer] General Anesthesia, Adult, Care After This sheet gives you information about how to care for yourself after your procedure. Your health care provider may also give you more specific instructions. If you have problems or questions, contact your health care provider. What can I expect after the procedure? After the procedure, the following side effects are common: Pain or discomfort at the IV site. Nausea. Vomiting. Sore throat. Trouble concentrating. Feeling cold or chills. Feeling weak or tired. Sleepiness and fatigue. Soreness and body aches. These side effects can affect parts  of the body that were not involved in surgery. Follow these instructions at home: For the time period you were told by your health care provider:  Rest. Do not participate in activities where you could fall or become injured. Do not drive or use machinery. Do not drink alcohol. Do not take sleeping pills or medicines that cause drowsiness. Do not make important decisions or sign legal documents. Do not take care of children on your own. Eating and drinking Follow any instructions from your health care provider about eating or drinking restrictions. When you feel hungry, start by eating small amounts of foods that are soft and easy to digest (bland), such as toast. Gradually return to your regular diet. Drink enough fluid to keep your urine pale yellow. If you vomit,  rehydrate by drinking water, juice, or clear broth. General instructions If you have sleep apnea, surgery and certain medicines can increase your risk for breathing problems. Follow instructions from your health care provider about wearing your sleep device: Anytime you are sleeping, including during daytime naps. While taking prescription pain medicines, sleeping medicines, or medicines that make you drowsy. Have a responsible adult stay with you for the time you are told. It is important to have someone help care for you until you are awake and alert. Return to your normal activities as told by your health care provider. Ask your health care provider what activities are safe for you. Take over-the-counter and prescription medicines only as told by your health care provider. If you smoke, do not smoke without supervision. Keep all follow-up visits as told by your health care provider. This is important. Contact a health care provider if: You have nausea or vomiting that does not get better with medicine. You cannot eat or drink without vomiting. You have pain that does not get better with medicine. You are unable to pass  urine. You develop a skin rash. You have a fever. You have redness around your IV site that gets worse. Get help right away if: You have difficulty breathing. You have chest pain. You have blood in your urine or stool, or you vomit blood. Summary After the procedure, it is common to have a sore throat or nausea. It is also common to feel tired. Have a responsible adult stay with you for the time you are told. It is important to have someone help care for you until you are awake and alert. When you feel hungry, start by eating small amounts of foods that are soft and easy to digest (bland), such as toast. Gradually return to your regular diet. Drink enough fluid to keep your urine pale yellow. Return to your normal activities as told by your health care provider. Ask your health care provider what activities are safe for you. This information is not intended to replace advice given to you by your health care provider. Make sure you discuss any questions you have with your health care provider. Document Revised: 02/16/2020 Document Reviewed: 09/15/2019 Elsevier Patient Education  2022 [employer]. How to Use Chlorhexidine for Bathing Chlorhexidine gluconate (CHG) is a germ-killing (antiseptic) solution that is used to clean the skin. It can get rid of the bacteria that normally live on the skin and can keep them away for about 24 hours. To clean your skin with CHG, you may be given: A CHG solution to use in the shower or as part of a sponge bath. A prepackaged cloth that contains CHG. Cleaning your skin with CHG may help lower the risk for infection: While you are staying in the intensive care unit of the hospital. If you have a vascular access, such as a central line, to provide short-term or long-term access to your veins. If you have a catheter to drain urine from your bladder. If you are on a ventilator. A ventilator is a machine that helps you breathe by moving air in and out of your  lungs. After surgery. What are the risks? Risks of using CHG include: A skin reaction. Hearing loss, if CHG gets in your ears and you have a perforated eardrum. Eye injury, if CHG gets in your eyes and is not rinsed out. The CHG product catching fire. Make sure that you avoid smoking and flames after applying CHG to your skin. Do not  use CHG: If you have a chlorhexidine allergy or have previously reacted to chlorhexidine. On babies younger than 9 months of age. How to use CHG solution Use CHG only as told by your health care provider, and follow the instructions on the label. Use the full amount of CHG as directed. Usually, this is one bottle. During a shower Follow these steps when using CHG solution during a shower (unless your health care provider gives you different instructions): Start the shower. Use your normal soap and shampoo to wash your face and hair. Turn off the shower or move out of the shower stream. Pour the CHG onto a clean washcloth. Do not use any type of brush or rough-edged sponge. Starting at your neck, lather your body down to your toes. Make sure you follow these instructions: If you will be having surgery, pay special attention to the part of your body where you will be having surgery. Scrub this area for at least 1 minute. Do not use CHG on your head or face. If the solution gets into your ears or eyes, rinse them well with water. Avoid your genital area. Avoid any areas of skin that have broken skin, cuts, or scrapes. Scrub your back and under your arms. Make sure to wash skin folds. Let the lather sit on your skin for 1-2 minutes or as long as told by your health care provider. Thoroughly rinse your entire body in the shower. Make sure that all body creases and crevices are rinsed well. Dry off with a clean towel. Do not put any substances on your body afterward--such as powder, lotion, or perfume--unless you are told to do so by your health care provider.  Only use lotions that are recommended by the manufacturer. Put on clean clothes or pajamas. If it is the night before your surgery, sleep in clean sheets.  During a sponge bath Follow these steps when using CHG solution during a sponge bath (unless your health care provider gives you different instructions): Use your normal soap and shampoo to wash your face and hair. Pour the CHG onto a clean washcloth. Starting at your neck, lather your body down to your toes. Make sure you follow these instructions: If you will be having surgery, pay special attention to the part of your body where you will be having surgery. Scrub this area for at least 1 minute. Do not use CHG on your head or face. If the solution gets into your ears or eyes, rinse them well with water. Avoid your genital area. Avoid any areas of skin that have broken skin, cuts, or scrapes. Scrub your back and under your arms. Make sure to wash skin folds. Let the lather sit on your skin for 1-2 minutes or as long as told by your health care provider. Using a different clean, wet washcloth, thoroughly rinse your entire body. Make sure that all body creases and crevices are rinsed well. Dry off with a clean towel. Do not put any substances on your body afterward--such as powder, lotion, or perfume--unless you are told to do so by your health care provider. Only use lotions that are recommended by the manufacturer. Put on clean clothes or pajamas. If it is the night before your surgery, sleep in clean sheets. How to use CHG prepackaged cloths Only use CHG cloths as told by your health care provider, and follow the instructions on the label. Use the CHG cloth on clean, dry skin. Do not use the CHG cloth on your  head or face unless your health care provider tells you to. When washing with the CHG cloth: Avoid your genital area. Avoid any areas of skin that have broken skin, cuts, or scrapes. Before surgery Follow these steps when using a  CHG cloth to clean before surgery (unless your health care provider gives you different instructions): Using the CHG cloth, vigorously scrub the part of your body where you will be having surgery. Scrub using a back-and-forth motion for 3 minutes. The area on your body should be completely wet with CHG when you are done scrubbing. Do not rinse. Discard the cloth and let the area air-dry. Do not put any substances on the area afterward, such as powder, lotion, or perfume. Put on clean clothes or pajamas. If it is the night before your surgery, sleep in clean sheets.  For general bathing Follow these steps when using CHG cloths for general bathing (unless your health care provider gives you different instructions). Use a separate CHG cloth for each area of your body. Make sure you wash between any folds of skin and between your fingers and toes. Wash your body in the following order, switching to a new cloth after each step: The front of your neck, shoulders, and chest. Both of your arms, under your arms, and your hands. Your stomach and groin area, avoiding the genitals. Your right leg and foot. Your left leg and foot. The back of your neck, your back, and your buttocks. Do not rinse. Discard the cloth and let the area air-dry. Do not put any substances on your body afterward--such as powder, lotion, or perfume--unless you are told to do so by your health care provider. Only use lotions that are recommended by the manufacturer. Put on clean clothes or pajamas. Contact a health care provider if: Your skin gets irritated after scrubbing. You have questions about using your solution or cloth. You swallow any chlorhexidine. Call your local poison control center ([phone] in the [place] Get help right away if: Your eyes itch badly, or they become very red or swollen. Your skin itches badly and is red or swollen. Your hearing changes. You have trouble seeing. You have swelling or tingling in  your mouth or throat. You have trouble breathing. These symptoms may represent a serious problem that is an emergency. Do not wait to see if the symptoms will go away. Get medical help right away. Call your local emergency services (911 in the [place] Do not drive yourself to the hospital. Summary Chlorhexidine gluconate (CHG) is a germ-killing (antiseptic) solution that is used to clean the skin. Cleaning your skin with CHG may help to lower your risk for infection. You may be given CHG to use for bathing. It may be in a bottle or in a prepackaged cloth to use on your skin. Carefully follow your health care provider's instructions and the instructions on the product label. Do not use CHG if you have a chlorhexidine allergy. Contact your health care provider if your skin gets irritated after scrubbing. This information is not intended to replace advice given to you by your health care provider. Make sure you discuss any questions you have with your health care provider. Document Revised: 08/13/2020 Document Reviewed: 08/13/2020 Elsevier Patient Education  2022 [employer]

## 2021-06-25 ENCOUNTER — Telehealth (INDEPENDENT_AMBULATORY_CARE_PROVIDER_SITE_OTHER): Payer: Medicaid Other | Admitting: General Surgery

## 2021-06-25 DIAGNOSIS — K4090 Unilateral inguinal hernia, without obstruction or gangrene, not specified as recurrent: Secondary | ICD-10-CM

## 2021-06-25 MED ORDER — OXYCODONE HCL 5 MG PO TABS: 5.0000 mg | ORAL_TABLET | ORAL | 0 refills | Status: DC | PRN

## 2021-06-25 NOTE — Telephone Encounter (Signed)
Call placed to patient and patient made aware per VM.  

## 2021-06-25 NOTE — Telephone Encounter (Signed)
[facility]  Worsening pain. Roxicodone 5mg  q4 PRN # [id number], has surgery planned for Friday. If worsening pain or bulge that get stucks out needs to go to ED.

## 2021-06-26 ENCOUNTER — Encounter (HOSPITAL_COMMUNITY)
Admission: RE | Admit: 2021-06-26 | Discharge: 2021-06-26 | Disposition: A | Discharge status: Home / Self Care | Payer: Medicaid Other | Source: Ambulatory Visit | Attending: General Surgery | Admitting: General Surgery

## 2021-06-26 DIAGNOSIS — B182 Chronic viral hepatitis C: Secondary | ICD-10-CM

## 2021-06-27 ENCOUNTER — Encounter (HOSPITAL_COMMUNITY): Payer: Self-pay

## 2021-06-27 ENCOUNTER — Encounter (HOSPITAL_COMMUNITY)
Admission: RE | Admit: 2021-06-27 | Discharge: 2021-06-27 | Disposition: A | Discharge status: Home / Self Care | Payer: Medicaid Other | Source: Ambulatory Visit | Attending: General Surgery | Admitting: General Surgery

## 2021-06-27 DIAGNOSIS — Z01818 Encounter for other preprocedural examination: Secondary | ICD-10-CM | POA: Diagnosis present

## 2021-06-27 DIAGNOSIS — B182 Chronic viral hepatitis C: Secondary | ICD-10-CM | POA: Diagnosis not present

## 2021-06-27 LAB — CBC WITH DIFFERENTIAL/PLATELET
Abs Immature Granulocytes: 0 10*3/uL (ref 0.00–0.07)
Basophils Absolute: 0.1 10*3/uL (ref 0.0–0.1)
Basophils Relative: 1 %
Eosinophils Absolute: 0.1 10*3/uL (ref 0.0–0.5)
Eosinophils Relative: 2 %
HCT: 38.8 % — ABNORMAL LOW (ref 39.0–52.0)
Hemoglobin: 12.8 g/dL — ABNORMAL LOW (ref 13.0–17.0)
Immature Granulocytes: 0 %
Lymphocytes Relative: 28 %
Lymphs Abs: 1.6 10*3/uL (ref 0.7–4.0)
MCH: 30.3 pg (ref 26.0–34.0)
MCHC: 33 g/dL (ref 30.0–36.0)
MCV: 91.7 fL (ref 80.0–100.0)
Monocytes Absolute: 0.7 10*3/uL (ref 0.1–1.0)
Monocytes Relative: 13 %
Neutro Abs: 3.1 10*3/uL (ref 1.7–7.7)
Neutrophils Relative %: 56 %
Platelets: 267 10*3/uL (ref 150–400)
RBC: 4.23 MIL/uL (ref 4.22–5.81)
RDW: 13 % (ref 11.5–15.5)
WBC: 5.5 10*3/uL (ref 4.0–10.5)
nRBC: 0 % (ref 0.0–0.2)

## 2021-06-27 LAB — COMPREHENSIVE METABOLIC PANEL
ALT: 22 U/L (ref 0–44)
AST: 24 U/L (ref 15–41)
Albumin: 3.6 g/dL (ref 3.5–5.0)
Alkaline Phosphatase: 87 U/L (ref 38–126)
Anion gap: 9 (ref 5–15)
BUN: 14 mg/dL (ref 6–20)
CO2: 27 mmol/L (ref 22–32)
Calcium: 9 mg/dL (ref 8.9–10.3)
Chloride: 101 mmol/L (ref 98–111)
Creatinine, Ser: 0.71 mg/dL (ref 0.61–1.24)
GFR, Estimated: >60 mL/min (ref >60)
Glucose, Bld: 134 mg/dL — ABNORMAL HIGH (ref 70–99)
Potassium: 3.8 mmol/L (ref 3.5–5.1)
Sodium: 137 mmol/L (ref 135–145)
Total Bilirubin: 0.4 mg/dL (ref 0.3–1.2)
Total Protein: 7.6 g/dL (ref 6.5–8.1)

## 2021-06-27 LAB — PROTIME-INR
INR: 1 (ref 0.8–1.2)
Prothrombin Time: 13.1 seconds (ref 11.4–15.2)

## 2021-06-28 ENCOUNTER — Ambulatory Visit (HOSPITAL_COMMUNITY): Admission: RE | Admit: 2021-06-28 | Payer: Medicaid Other | Source: Home / Self Care | Admitting: General Surgery

## 2021-06-28 ENCOUNTER — Encounter (HOSPITAL_COMMUNITY): Payer: Self-pay | Admitting: Certified Registered"

## 2021-06-28 ENCOUNTER — Encounter (HOSPITAL_COMMUNITY): Admission: RE | Payer: Self-pay | Source: Home / Self Care

## 2021-06-28 ENCOUNTER — Encounter (HOSPITAL_COMMUNITY): Payer: Self-pay | Admitting: General Surgery

## 2021-06-28 DIAGNOSIS — K4090 Unilateral inguinal hernia, without obstruction or gangrene, not specified as recurrent: Secondary | ICD-10-CM

## 2021-06-28 SURGERY — REPAIR, HERNIA, INGUINAL, ADULT
Anesthesia: General | Laterality: Right

## 2021-06-28 MED ORDER — GLYCOPYRROLATE PF 0.2 MG/ML IJ SOSY
PREFILLED_SYRINGE | INTRAMUSCULAR | Status: AC
  Filled 2021-06-28: qty 1

## 2021-06-28 MED ORDER — CHLORHEXIDINE GLUCONATE CLOTH 2 % EX PADS: 6.0000 | MEDICATED_PAD | Freq: Once | CUTANEOUS | Status: DC

## 2021-06-28 MED ORDER — LIDOCAINE HCL (PF) 2 % IJ SOLN
INTRAMUSCULAR | Status: AC
  Filled 2021-06-28: qty 5

## 2021-06-28 MED ORDER — ONDANSETRON HCL 4 MG/2ML IJ SOLN
INTRAMUSCULAR | Status: AC
  Filled 2021-06-28: qty 2

## 2021-06-28 MED ORDER — MIDAZOLAM HCL 2 MG/2ML IJ SOLN
INTRAMUSCULAR | Status: AC
  Filled 2021-06-28: qty 2

## 2021-06-28 MED ORDER — PROPOFOL 10 MG/ML IV BOLUS
INTRAVENOUS | Status: AC
  Filled 2021-06-28: qty 40

## 2021-06-28 MED ORDER — FENTANYL CITRATE (PF) 250 MCG/5ML IJ SOLN
INTRAMUSCULAR | Status: AC
  Filled 2021-06-28: qty 5

## 2021-06-28 MED ORDER — SODIUM CHLORIDE 0.9 % IV SOLN: 2.0000 g | INTRAVENOUS | Status: DC

## 2021-06-28 MED ORDER — DEXAMETHASONE SODIUM PHOSPHATE 10 MG/ML IJ SOLN
INTRAMUSCULAR | Status: AC
  Filled 2021-06-28: qty 1

## 2021-06-28 NOTE — OR Nursing (Signed)
No Show for surgery.  Multiple attempts tried to reach the patient. Case cancelled

## 2021-07-30 ENCOUNTER — Encounter (HOSPITAL_COMMUNITY)
Admission: RE | Admit: 2021-07-30 | Discharge: 2021-07-30 | Disposition: A | Discharge status: Home / Self Care | Payer: Medicaid Other | Source: Ambulatory Visit | Attending: General Surgery | Admitting: General Surgery

## 2021-07-30 NOTE — Patient Instructions (Signed)
[name] [name]  07/30/2021     @PREFPERIOPPHARMACY @   Your procedure is scheduled on 08/02/2021.   Report to [facility] at  0730 A.M.   Call this number if you have problems the morning of surgery:  [phone]   Remember:  Do not eat or drink after midnight.      Take these medicines the morning of surgery with A SIP OF WATER                    oxycodone(if needed).    Do not wear jewelry, make-up or nail polish.  Do not wear lotions, powders, or perfumes, or deodorant.  Do not shave 48 hours prior to surgery.  Men may shave face and neck.  Do not bring valuables to the hospital.  [facility] is not responsible for any belongings or valuables.  Contacts, dentures or bridgework may not be worn into surgery.  Leave your suitcase in the car.  After surgery it may be brought to your room.  For patients admitted to the hospital, discharge time will be determined by your treatment team.  Patients discharged the day of surgery will not be allowed to drive home and must have someone with them for 24 hours.    Special instructions:   DO NOT smoke tobacco or vape for 24 hours before your procedure.  Please read over the following fact sheets that you were given. Coughing and Deep Breathing, Surgical Site Infection Prevention, Anesthesia Post-op Instructions, and Care and Recovery After Surgery      Open Hernia Repair, Adult, Care After What can I expect after the procedure? After the procedure, it is common to have: Mild discomfort. Slight bruising. Mild swelling. Pain in the belly (abdomen). A small amount of blood from the cut from surgery (incision). Follow these instructions at home: Your doctor may give you more specific instructions. If you have problems, call your doctor. Medicines Take over-the-counter and prescription medicines only as told by your doctor. If told, take steps to prevent problems with pooping (constipation). You may need to: Drink  enough fluid to keep your pee (urine) pale yellow. Take medicines. You will be told what medicines to take. Eat foods that are high in fiber. These include beans, whole grains, and fresh fruits and vegetables. Limit foods that are high in fat and sugar. These include fried or sweet foods. Ask your doctor if you should avoid driving or using machines while you are taking your medicine. Incision care  Follow instructions from your doctor about how to take care of your incision. Make sure you: Wash your hands with soap and water for at least 20 seconds before and after you change your bandage (dressing). If you cannot use soap and water, use hand sanitizer. Change your bandage. Leave stitches or skin glue in place for at least 2 weeks. Leave tape strips alone unless you are told to take them off. You may trim the edges of the tape strips if they curl up. Check your incision every day for signs of infection. Check for: More redness, swelling, or pain. More fluid or blood. Warmth. Pus or a bad smell. Wear loose, soft clothing while your incision heals. Activity  Rest as told by your doctor. Do not lift anything that is heavier than 10 lb (4.5 kg), or the limit that you are told. Do not play contact sports until your doctor says that this is safe. If you  were given a sedative during your procedure, do not drive or use machines until your doctor says that it is safe. A sedative is a medicine that helps you relax. Return to your normal activities when your doctor says that it is safe. General instructions Do not take baths, swim, or use a hot tub. Ask your doctor about taking showers or sponge baths. Hold a pillow over your belly when you cough or sneeze. This helps with pain. Do not smoke or use any products that contain nicotine or tobacco. If you need help quitting, ask your doctor. Keep all follow-up visits. Contact a doctor if: You have any of these signs of infection in or around your  incision: More redness, swelling, or pain. More fluid or blood. Warmth. Pus. A bad smell. You have a fever or chills. You have blood in your poop (stool). You have not pooped (had a bowel movement) in 2-3 days. Medicine does not help your pain. Get help right away if: You have chest pain, or you are short of breath. You feel faint or light-headed. You have very bad pain. You vomit and your pain is worse. You have pain, swelling, or redness in a leg. These symptoms may be an emergency. Get help right away. Call your local emergency services (911 in the [place] Do not wait to see if the symptoms will go away. Do not drive yourself to the hospital. Summary After this procedure, it is common to have mild discomfort, slight bruising, and mild swelling. Follow instructions from your doctor about how to take care of your cut from surgery (incision). Check every day for signs of infection. Do not lift heavy objects or play contact sports until your doctor says it is safe. Return to your normal activities as told by your doctor. This information is not intended to replace advice given to you by your health care provider. Make sure you discuss any questions you have with your health care provider. Document Revised: 01/16/2020 Document Reviewed: 01/16/2020 Elsevier Patient Education  2022 [place] Anesthesia, Adult, Care After This sheet gives you information about how to care for yourself after your procedure. Your health care provider may also give you more specific instructions. If you have problems or questions, contact your health care provider. What can I expect after the procedure? After the procedure, the following side effects are common: Pain or discomfort at the IV site. Nausea. Vomiting. Sore throat. Trouble concentrating. Feeling cold or chills. Feeling weak or tired. Sleepiness and fatigue. Soreness and body aches. These side effects can affect parts of the body  that were not involved in surgery. Follow these instructions at home: For the time period you were told by your health care provider:  Rest. Do not participate in activities where you could fall or become injured. Do not drive or use machinery. Do not drink alcohol. Do not take sleeping pills or medicines that cause drowsiness. Do not make important decisions or sign legal documents. Do not take care of children on your own. Eating and drinking Follow any instructions from your health care provider about eating or drinking restrictions. When you feel hungry, start by eating small amounts of foods that are soft and easy to digest (bland), such as toast. Gradually return to your regular diet. Drink enough fluid to keep your urine pale yellow. If you vomit, rehydrate by drinking water, juice, or clear broth. General instructions If you have sleep apnea, surgery and certain medicines can increase your risk for  breathing problems. Follow instructions from your health care provider about wearing your sleep device: Anytime you are sleeping, including during daytime naps. While taking prescription pain medicines, sleeping medicines, or medicines that make you drowsy. Have a responsible adult stay with you for the time you are told. It is important to have someone help care for you until you are awake and alert. Return to your normal activities as told by your health care provider. Ask your health care provider what activities are safe for you. Take over-the-counter and prescription medicines only as told by your health care provider. If you smoke, do not smoke without supervision. Keep all follow-up visits as told by your health care provider. This is important. Contact a health care provider if: You have nausea or vomiting that does not get better with medicine. You cannot eat or drink without vomiting. You have pain that does not get better with medicine. You are unable to pass urine. You  develop a skin rash. You have a fever. You have redness around your IV site that gets worse. Get help right away if: You have difficulty breathing. You have chest pain. You have blood in your urine or stool, or you vomit blood. Summary After the procedure, it is common to have a sore throat or nausea. It is also common to feel tired. Have a responsible adult stay with you for the time you are told. It is important to have someone help care for you until you are awake and alert. When you feel hungry, start by eating small amounts of foods that are soft and easy to digest (bland), such as toast. Gradually return to your regular diet. Drink enough fluid to keep your urine pale yellow. Return to your normal activities as told by your health care provider. Ask your health care provider what activities are safe for you. This information is not intended to replace advice given to you by your health care provider. Make sure you discuss any questions you have with your health care provider. Document Revised: 02/16/2020 Document Reviewed: 09/15/2019 Elsevier Patient Education  2022 [place]. How to Use Chlorhexidine for Bathing Chlorhexidine gluconate (CHG) is a germ-killing (antiseptic) solution that is used to clean the skin. It can get rid of the bacteria that normally live on the skin and can keep them away for about 24 hours. To clean your skin with CHG, you may be given: A CHG solution to use in the shower or as part of a sponge bath. A prepackaged cloth that contains CHG. Cleaning your skin with CHG may help lower the risk for infection: While you are staying in the intensive care unit of the hospital. If you have a vascular access, such as a central line, to provide short-term or long-term access to your veins. If you have a catheter to drain urine from your bladder. If you are on a ventilator. A ventilator is a machine that helps you breathe by moving air in and out of your lungs. After  surgery. What are the risks? Risks of using CHG include: A skin reaction. Hearing loss, if CHG gets in your ears and you have a perforated eardrum. Eye injury, if CHG gets in your eyes and is not rinsed out. The CHG product catching fire. Make sure that you avoid smoking and flames after applying CHG to your skin. Do not use CHG: If you have a chlorhexidine allergy or have previously reacted to chlorhexidine. On babies younger than 50 months of age. How to  use CHG solution Use CHG only as told by your health care provider, and follow the instructions on the label. Use the full amount of CHG as directed. Usually, this is one bottle. During a shower Follow these steps when using CHG solution during a shower (unless your health care provider gives you different instructions): Start the shower. Use your normal soap and shampoo to wash your face and hair. Turn off the shower or move out of the shower stream. Pour the CHG onto a clean washcloth. Do not use any type of brush or rough-edged sponge. Starting at your neck, lather your body down to your toes. Make sure you follow these instructions: If you will be having surgery, pay special attention to the part of your body where you will be having surgery. Scrub this area for at least 1 minute. Do not use CHG on your head or face. If the solution gets into your ears or eyes, rinse them well with water. Avoid your genital area. Avoid any areas of skin that have broken skin, cuts, or scrapes. Scrub your back and under your arms. Make sure to wash skin folds. Let the lather sit on your skin for 1-2 minutes or as long as told by your health care provider. Thoroughly rinse your entire body in the shower. Make sure that all body creases and crevices are rinsed well. Dry off with a clean towel. Do not put any substances on your body afterward--such as powder, lotion, or perfume--unless you are told to do so by your health care provider. Only use lotions  that are recommended by the manufacturer. Put on clean clothes or pajamas. If it is the night before your surgery, sleep in clean sheets.  During a sponge bath Follow these steps when using CHG solution during a sponge bath (unless your health care provider gives you different instructions): Use your normal soap and shampoo to wash your face and hair. Pour the CHG onto a clean washcloth. Starting at your neck, lather your body down to your toes. Make sure you follow these instructions: If you will be having surgery, pay special attention to the part of your body where you will be having surgery. Scrub this area for at least 1 minute. Do not use CHG on your head or face. If the solution gets into your ears or eyes, rinse them well with water. Avoid your genital area. Avoid any areas of skin that have broken skin, cuts, or scrapes. Scrub your back and under your arms. Make sure to wash skin folds. Let the lather sit on your skin for 1-2 minutes or as long as told by your health care provider. Using a different clean, wet washcloth, thoroughly rinse your entire body. Make sure that all body creases and crevices are rinsed well. Dry off with a clean towel. Do not put any substances on your body afterward--such as powder, lotion, or perfume--unless you are told to do so by your health care provider. Only use lotions that are recommended by the manufacturer. Put on clean clothes or pajamas. If it is the night before your surgery, sleep in clean sheets. How to use CHG prepackaged cloths Only use CHG cloths as told by your health care provider, and follow the instructions on the label. Use the CHG cloth on clean, dry skin. Do not use the CHG cloth on your head or face unless your health care provider tells you to. When washing with the CHG cloth: Avoid your genital area. Avoid any areas  of skin that have broken skin, cuts, or scrapes. Before surgery Follow these steps when using a CHG cloth to  clean before surgery (unless your health care provider gives you different instructions): Using the CHG cloth, vigorously scrub the part of your body where you will be having surgery. Scrub using a back-and-forth motion for 3 minutes. The area on your body should be completely wet with CHG when you are done scrubbing. Do not rinse. Discard the cloth and let the area air-dry. Do not put any substances on the area afterward, such as powder, lotion, or perfume. Put on clean clothes or pajamas. If it is the night before your surgery, sleep in clean sheets.  For general bathing Follow these steps when using CHG cloths for general bathing (unless your health care provider gives you different instructions). Use a separate CHG cloth for each area of your body. Make sure you wash between any folds of skin and between your fingers and toes. Wash your body in the following order, switching to a new cloth after each step: The front of your neck, shoulders, and chest. Both of your arms, under your arms, and your hands. Your stomach and groin area, avoiding the genitals. Your right leg and foot. Your left leg and foot. The back of your neck, your back, and your buttocks. Do not rinse. Discard the cloth and let the area air-dry. Do not put any substances on your body afterward--such as powder, lotion, or perfume--unless you are told to do so by your health care provider. Only use lotions that are recommended by the manufacturer. Put on clean clothes or pajamas. Contact a health care provider if: Your skin gets irritated after scrubbing. You have questions about using your solution or cloth. You swallow any chlorhexidine. Call your local poison control center (1-267 704 0054 in the [place] Get help right away if: Your eyes itch badly, or they become very red or swollen. Your skin itches badly and is red or swollen. Your hearing changes. You have trouble seeing. You have swelling or tingling in your mouth or  throat. You have trouble breathing. These symptoms may represent a serious problem that is an emergency. Do not wait to see if the symptoms will go away. Get medical help right away. Call your local emergency services (911 in the [place] Do not drive yourself to the hospital. Summary Chlorhexidine gluconate (CHG) is a germ-killing (antiseptic) solution that is used to clean the skin. Cleaning your skin with CHG may help to lower your risk for infection. You may be given CHG to use for bathing. It may be in a bottle or in a prepackaged cloth to use on your skin. Carefully follow your health care provider's instructions and the instructions on the product label. Do not use CHG if you have a chlorhexidine allergy. Contact your health care provider if your skin gets irritated after scrubbing. This information is not intended to replace advice given to you by your health care provider. Make sure you discuss any questions you have with your health care provider. Document Revised: 08/13/2020 Document Reviewed: 08/13/2020 Elsevier Patient Education  2022 [employer].

## 2021-07-31 ENCOUNTER — Other Ambulatory Visit: Payer: Self-pay

## 2021-07-31 ENCOUNTER — Encounter (HOSPITAL_COMMUNITY): Payer: Self-pay

## 2021-07-31 ENCOUNTER — Encounter (HOSPITAL_COMMUNITY)
Admission: RE | Admit: 2021-07-31 | Discharge: 2021-07-31 | Disposition: A | Discharge status: Home / Self Care | Payer: Medicaid Other | Source: Ambulatory Visit | Attending: General Surgery | Admitting: General Surgery

## 2021-08-02 ENCOUNTER — Ambulatory Visit (HOSPITAL_BASED_OUTPATIENT_CLINIC_OR_DEPARTMENT_OTHER): Payer: Medicaid Other | Admitting: Anesthesiology

## 2021-08-02 ENCOUNTER — Ambulatory Visit (HOSPITAL_COMMUNITY): Payer: Medicaid Other | Admitting: Anesthesiology

## 2021-08-02 ENCOUNTER — Other Ambulatory Visit: Payer: Self-pay

## 2021-08-02 ENCOUNTER — Ambulatory Visit (HOSPITAL_COMMUNITY)
Admission: RE | Admit: 2021-08-02 | Discharge: 2021-08-02 | Disposition: A | Discharge status: Home / Self Care | Payer: Medicaid Other | Attending: General Surgery | Admitting: General Surgery

## 2021-08-02 ENCOUNTER — Encounter (HOSPITAL_COMMUNITY): Payer: Self-pay | Admitting: General Surgery

## 2021-08-02 ENCOUNTER — Encounter (HOSPITAL_COMMUNITY)
Admission: RE | Disposition: A | Discharge status: Home / Self Care | Payer: Self-pay | Source: Home / Self Care | Attending: General Surgery

## 2021-08-02 DIAGNOSIS — K4090 Unilateral inguinal hernia, without obstruction or gangrene, not specified as recurrent: Secondary | ICD-10-CM

## 2021-08-02 DIAGNOSIS — B182 Chronic viral hepatitis C: Secondary | ICD-10-CM | POA: Diagnosis not present

## 2021-08-02 DIAGNOSIS — F17210 Nicotine dependence, cigarettes, uncomplicated: Secondary | ICD-10-CM | POA: Insufficient documentation

## 2021-08-02 HISTORY — PX: INGUINAL HERNIA REPAIR: SHX194

## 2021-08-02 SURGERY — REPAIR, HERNIA, INGUINAL, ADULT
Anesthesia: General | Site: Inguinal | Laterality: Right

## 2021-08-02 MED ORDER — DEXAMETHASONE SODIUM PHOSPHATE 10 MG/ML IJ SOLN
INTRAMUSCULAR | Status: AC
  Filled 2021-08-02: qty 1

## 2021-08-02 MED ORDER — DEXAMETHASONE SODIUM PHOSPHATE 10 MG/ML IJ SOLN
INTRAMUSCULAR | Status: DC | PRN
  Administered 2021-08-02: 10 mg via INTRAVENOUS

## 2021-08-02 MED ORDER — CHLORHEXIDINE GLUCONATE 0.12 % MT SOLN
15.0000 mL | Freq: Once | OROMUCOSAL | Status: AC
  Administered 2021-08-02: 15 mL via OROMUCOSAL

## 2021-08-02 MED ORDER — ORAL CARE MOUTH RINSE: 15.0000 mL | Freq: Once | OROMUCOSAL | Status: AC

## 2021-08-02 MED ORDER — BUPIVACAINE HCL (300 MG DOSE) 3 X 100 MG IL IMPL
DRUG_IMPLANT | Status: DC | PRN
  Administered 2021-08-02: 300 mg

## 2021-08-02 MED ORDER — LACTATED RINGERS IV SOLN: INTRAVENOUS | Status: DC

## 2021-08-02 MED ORDER — CHLORHEXIDINE GLUCONATE CLOTH 2 % EX PADS: 6.0000 | MEDICATED_PAD | Freq: Once | CUTANEOUS | Status: DC

## 2021-08-02 MED ORDER — ONDANSETRON HCL 4 MG/2ML IJ SOLN: 4.0000 mg | Freq: Once | INTRAMUSCULAR | Status: DC | PRN

## 2021-08-02 MED ORDER — SODIUM CHLORIDE 0.9 % IR SOLN
Status: DC | PRN
  Administered 2021-08-02: 1000 mL

## 2021-08-02 MED ORDER — LIDOCAINE HCL (CARDIAC) PF 100 MG/5ML IV SOSY
PREFILLED_SYRINGE | INTRAVENOUS | Status: DC | PRN
  Administered 2021-08-02: 50 mg via INTRAVENOUS

## 2021-08-02 MED ORDER — ONDANSETRON HCL 4 MG/2ML IJ SOLN
INTRAMUSCULAR | Status: AC
  Filled 2021-08-02: qty 2

## 2021-08-02 MED ORDER — MIDAZOLAM HCL 5 MG/5ML IJ SOLN
INTRAMUSCULAR | Status: DC | PRN
Start: 2021-08-02 — End: 2021-08-02
  Administered 2021-08-02: 2 mg via INTRAVENOUS

## 2021-08-02 MED ORDER — ROCURONIUM BROMIDE 10 MG/ML (PF) SYRINGE
PREFILLED_SYRINGE | INTRAVENOUS | Status: AC
  Filled 2021-08-02: qty 10

## 2021-08-02 MED ORDER — KETOROLAC TROMETHAMINE 30 MG/ML IJ SOLN
INTRAMUSCULAR | Status: AC
  Filled 2021-08-02: qty 1

## 2021-08-02 MED ORDER — BUPIVACAINE HCL (300 MG DOSE) 3 X 100 MG IL IMPL
DRUG_IMPLANT | Status: AC
  Filled 2021-08-02: qty 100

## 2021-08-02 MED ORDER — CEFAZOLIN SODIUM-DEXTROSE 2-4 GM/100ML-% IV SOLN
2.0000 g | INTRAVENOUS | Status: AC
  Administered 2021-08-02: 2 g via INTRAVENOUS

## 2021-08-02 MED ORDER — SUGAMMADEX SODIUM 200 MG/2ML IV SOLN
INTRAVENOUS | Status: DC | PRN
  Administered 2021-08-02: 400 mg via INTRAVENOUS

## 2021-08-02 MED ORDER — FENTANYL CITRATE (PF) 250 MCG/5ML IJ SOLN
INTRAMUSCULAR | Status: AC
  Filled 2021-08-02: qty 5

## 2021-08-02 MED ORDER — EPHEDRINE 5 MG/ML INJ
INTRAVENOUS | Status: AC
  Filled 2021-08-02: qty 5

## 2021-08-02 MED ORDER — OXYCODONE HCL 5 MG PO TABS: 5.0000 mg | ORAL_TABLET | ORAL | 0 refills | Status: DC | PRN

## 2021-08-02 MED ORDER — MEPERIDINE HCL 50 MG/ML IJ SOLN: 6.2500 mg | INTRAMUSCULAR | Status: DC | PRN

## 2021-08-02 MED ORDER — PROPOFOL 10 MG/ML IV BOLUS
INTRAVENOUS | Status: AC
  Filled 2021-08-02: qty 20

## 2021-08-02 MED ORDER — ROCURONIUM BROMIDE 100 MG/10ML IV SOLN
INTRAVENOUS | Status: DC | PRN
  Administered 2021-08-02: 60 mg via INTRAVENOUS

## 2021-08-02 MED ORDER — MIDAZOLAM HCL 2 MG/2ML IJ SOLN
INTRAMUSCULAR | Status: AC
  Filled 2021-08-02: qty 2

## 2021-08-02 MED ORDER — KETOROLAC TROMETHAMINE 30 MG/ML IJ SOLN
30.0000 mg | Freq: Once | INTRAMUSCULAR | Status: AC
  Administered 2021-08-02: 30 mg via INTRAVENOUS

## 2021-08-02 MED ORDER — CEFAZOLIN SODIUM-DEXTROSE 2-4 GM/100ML-% IV SOLN
INTRAVENOUS | Status: AC
  Filled 2021-08-02: qty 100

## 2021-08-02 MED ORDER — FENTANYL CITRATE (PF) 100 MCG/2ML IJ SOLN
INTRAMUSCULAR | Status: DC | PRN
  Administered 2021-08-02: 100 ug via INTRAVENOUS

## 2021-08-02 MED ORDER — ONDANSETRON HCL 4 MG/2ML IJ SOLN
INTRAMUSCULAR | Status: AC
Start: 2021-08-02 — End: ?
  Filled 2021-08-02: qty 2

## 2021-08-02 MED ORDER — ONDANSETRON HCL 4 MG PO TABS: 4.0000 mg | ORAL_TABLET | Freq: Three times a day (TID) | ORAL | 1 refills | Status: AC | PRN

## 2021-08-02 MED ORDER — HYDROMORPHONE HCL 1 MG/ML IJ SOLN: 0.5000 mg | INTRAMUSCULAR | Status: DC | PRN

## 2021-08-02 MED ORDER — PROPOFOL 10 MG/ML IV BOLUS
INTRAVENOUS | Status: DC | PRN
  Administered 2021-08-02: 200 mg via INTRAVENOUS

## 2021-08-02 MED ORDER — LIDOCAINE HCL (PF) 2 % IJ SOLN
INTRAMUSCULAR | Status: AC
  Filled 2021-08-02: qty 5

## 2021-08-02 SURGICAL SUPPLY — 32 items
ADH SKN CLS APL DERMABOND .7 (GAUZE/BANDAGES/DRESSINGS) ×1
CLOTH BEACON ORANGE TIMEOUT ST (SAFETY) ×2 IMPLANT
COVER LIGHT HANDLE STERIS (MISCELLANEOUS) ×4 IMPLANT
DERMABOND ADVANCED (GAUZE/BANDAGES/DRESSINGS) ×1
DERMABOND ADVANCED .7 DNX12 (GAUZE/BANDAGES/DRESSINGS) ×1 IMPLANT
DRAIN PENROSE 0.5X18 (DRAIN) ×2 IMPLANT
ELECT REM PT RETURN 9FT ADLT (ELECTROSURGICAL) ×2
ELECTRODE REM PT RTRN 9FT ADLT (ELECTROSURGICAL) ×1 IMPLANT
GAUZE 4X4 16PLY ~~LOC~~+RFID DBL (SPONGE) ×1 IMPLANT
GAUZE SPONGE 4X4 12PLY STRL (GAUZE/BANDAGES/DRESSINGS) ×2 IMPLANT
GLOVE SURG ENC MOIS LTX SZ6.5 (GLOVE) ×2 IMPLANT
GLOVE SURG LTX SZ6.5 (GLOVE) ×1 IMPLANT
GLOVE SURG POLYISO LF SZ6.5 (GLOVE) ×1 IMPLANT
GLOVE SURG POLYISO LF SZ7 (GLOVE) ×1 IMPLANT
GLOVE SURG UNDER POLY LF SZ7 (GLOVE) ×8 IMPLANT
GOWN STRL REUS W/TWL LRG LVL3 (GOWN DISPOSABLE) ×6 IMPLANT
INST SET MINOR GENERAL (KITS) ×2 IMPLANT
KIT TURNOVER KIT A (KITS) ×2 IMPLANT
MANIFOLD NEPTUNE II (INSTRUMENTS) ×2 IMPLANT
MESH MARLEX PLUG MEDIUM (Mesh General) ×1 IMPLANT
NS IRRIG 1000ML POUR BTL (IV SOLUTION) ×2 IMPLANT
PACK MINOR (CUSTOM PROCEDURE TRAY) ×2 IMPLANT
PAD ARMBOARD 7.5X6 YLW CONV (MISCELLANEOUS) ×2 IMPLANT
SET BASIN LINEN APH (SET/KITS/TRAYS/PACK) ×2 IMPLANT
SOL PREP PROV IODINE SCRUB 4OZ (MISCELLANEOUS) ×2 IMPLANT
SUT MNCRL AB 4-0 PS2 18 (SUTURE) ×2 IMPLANT
SUT NOVA NAB GS-22 2 2-0 T-19 (SUTURE) ×3 IMPLANT
SUT VIC AB 2-0 CT1 27 (SUTURE) ×2
SUT VIC AB 2-0 CT1 TAPERPNT 27 (SUTURE) ×1 IMPLANT
SUT VIC AB 3-0 SH 27 (SUTURE) ×2
SUT VIC AB 3-0 SH 27X BRD (SUTURE) ×1 IMPLANT
SUT VICRYL AB 3 0 TIES (SUTURE) ×1 IMPLANT

## 2021-08-02 NOTE — Transfer of Care (Signed)
Immediate Anesthesia Transfer of Care Note  Patient: [name] [name]  Procedure(s) Performed: HERNIA REPAIR INGUINAL ADULT (Right: Inguinal)  Patient Location: PACU  Anesthesia Type:General  Level of Consciousness: awake, drowsy and patient cooperative  Airway & Oxygen Therapy: Patient Spontanous Breathing and Patient connected to nasal cannula oxygen  Post-op Assessment: Report given to RN, Post -op Vital signs reviewed and stable and Patient moving all extremities X 4  Post vital signs: Reviewed and stable  Last Vitals:  Vitals Value Taken Time  BP 115/76 08/02/21 1020  Temp    Pulse 75 08/02/21 1024  Resp 16 08/02/21 1024  SpO2 93 % 08/02/21 1024  Vitals shown include unvalidated device data.  Last Pain:  Vitals:   08/02/21 0801  TempSrc: Oral  PainSc: 4          Complications: No notable events documented.

## 2021-08-02 NOTE — Anesthesia Procedure Notes (Signed)
Procedure Name: Intubation Date/Time: 08/02/2021 9:16 AM Performed by: Jonna Munro, CRNA Pre-anesthesia Checklist: Patient identified, Emergency Drugs available, Suction available, Patient being monitored and Timeout performed Patient Re-evaluated:Patient Re-evaluated prior to induction Oxygen Delivery Method: Circle system utilized Preoxygenation: Pre-oxygenation with 100% oxygen Induction Type: IV induction Ventilation: Mask ventilation without difficulty Laryngoscope Size: Mac and 3 Grade View: Grade I Tube type: Oral Number of attempts: 1 Airway Equipment and Method: Stylet Placement Confirmation: ETT inserted through vocal cords under direct vision, positive ETCO2, CO2 detector and breath sounds checked- equal and bilateral Secured at: 23 cm Tube secured with: Tape Dental Injury: Teeth and Oropharynx as per pre-operative assessment

## 2021-08-02 NOTE — Anesthesia Preprocedure Evaluation (Addendum)
Anesthesia Evaluation  Patient identified by MRN, date of birth, ID band Patient awake    Reviewed: Allergy & Precautions, NPO status , Patient's Chart, lab work & pertinent test results  Airway Mallampati: II  TM Distance: >3 FB Neck ROM: Full    Dental  (+) Dental Advisory Given, Teeth Intact   Pulmonary Current SmokerPatient did not abstain from smoking.,    Pulmonary exam normal breath sounds clear to auscultation       Cardiovascular negative cardio ROS Normal cardiovascular exam Rhythm:Regular Rate:Normal     Neuro/Psych negative neurological ROS  negative psych ROS   GI/Hepatic negative GI ROS, (+) Hepatitis -, C  Endo/Other  negative endocrine ROS  Renal/GU Renal disease  negative genitourinary   Musculoskeletal negative musculoskeletal ROS (+)   Abdominal   Peds negative pediatric ROS (+)  Hematology negative hematology ROS (+)   Anesthesia Other Findings   Reproductive/Obstetrics negative OB ROS                            Anesthesia Physical Anesthesia Plan  ASA: 2  Anesthesia Plan: General   Post-op Pain Management:    Induction: Intravenous  PONV Risk Score and Plan: 3 and Ondansetron and Dexamethasone  Airway Management Planned: Oral ETT and LMA  Additional Equipment:   Intra-op Plan:   Post-operative Plan: Extubation in OR  Informed Consent: I have reviewed the patients History and Physical, chart, labs and discussed the procedure including the risks, benefits and alternatives for the proposed anesthesia with the patient or authorized representative who has indicated his/her understanding and acceptance.     Dental advisory given  Plan Discussed with: CRNA and Surgeon  Anesthesia Plan Comments:        Anesthesia Quick Evaluation

## 2021-08-02 NOTE — Discharge Instructions (Signed)
Discharge Instructions Hernia:  Common Complaints: Pain at the incision site is common. This will improve with time. Take your pain medications as described below. Bruising in the side and down into the scrotum is common. If you have more bruising and are concerned call the office.  Some nausea is common and poor appetite. The main goal is to stay hydrated the first few days after surgery.  Numbness at the incision or the thigh is common.  If you start to have burning or tingling pain in your groin, this is from a nerve being pinched. Please call and we can prescribe you a different type of pain medication for nerve pain.   Diet/ Activity: Diet as tolerated. You may not have an appetite, but it is important to stay hydrated. Drink 64 ounces of water a day. Your appetite will return with time.  Shower per your regular routine daily.  Do not take hot showers. Take warm showers that are less than 10 minutes. Rest and listen to your body, but do not remain in bed all day.  Walk everyday for at least 15-20 minutes. Deep cough and move around every 1-2 hours in the first few days after surgery.  Do not pick at the dermabond glue on your incision sites.  This glue film will remain in place for 1-2 weeks and will start to peel off.  Do not place lotions or balms on your incision unless instructed to specifically by Dr. Henreitta Leber.  Do not lift > 10 lbs, perform excessive bending, pushing, pulling, squatting for 6-8 weeks after surgery.   Pain Expectations and Narcotics: -After surgery you will have pain associated with your incisions and this is normal. The pain is muscular and nerve pain, and will get better with time. -You are encouraged and expected to take non narcotic medications like tylenol and ibuprofen (when able) to treat pain as multiple modalities can aid with pain treatment. -Narcotics are only used when pain is severe or there is breakthrough pain. -You are not expected to have a pain score  of 0 after surgery, as we cannot prevent pain. A pain score of 3-4 that allows you to be functional, move, walk, and tolerate some activity is the goal. The pain will continue to improve over the days after surgery and is dependent on your surgery. -Due to [place] law, we are only able to give a certain amount of pain medication to treat post operative pain, and we only give additional narcotics on a patient by patient basis.  -For most laparoscopic surgery, studies have shown that the majority of patients only need 10-15 narcotic pills, and for open surgeries most patients only need 15-20.   -Having appropriate expectations of pain and knowledge of pain management with non narcotics is important as we do not want anyone to become addicted to narcotic pain medication.  -Using ice packs in the first 48 hours and heating pads after 48 hours, wearing an abdominal binder (when recommended), and using over the counter medications are all ways to help with pain management.   -Simple acts like meditation and mindfulness practices after surgery can also help with pain control and research has proven the benefit of these practices.  Medication: Take tylenol and ibuprofen as needed for pain control, alternating every 4-6 hours.  Example:  Tylenol 1000mg  @ 6am, 12noon, 6pm, (Do not exceed 4000mg  of tylenol a day). Ibuprofen 800mg  @ 9am, 3pm, 9pm, 3am (Do not exceed 3600mg  of ibuprofen a day).  Take  Roxicodone for breakthrough pain every 4 hours.  Take Colace for constipation related to narcotic pain medication. If you do not have a bowel movement in 2 days, take Miralax over the counter.  Drink plenty of water to also prevent constipation.   Contact Information: If you have questions or concerns, please call our office, [phone], Monday- Thursday 8AM-5PM and Friday 8AM-12Noon.  If it is after hours or on the weekend, please call [facility] Main Number, [phone], [phone], and ask to speak to the  surgeon on call for Dr. Henreitta Leber at [facility].

## 2021-08-02 NOTE — H&P (Signed)
[facility] History and Physical   [name] [name] is a 46 y.o. male.  HPI: [name] [name] is a 46 yo with a chronic right inguinal hernia that wants it repaired. He has been having worsening pain and bulge in the area. He did not make it to his last OR day, and has rescheduled. He says nothing has changed in his medical history since I saw him last.   Past Medical History:  Diagnosis Date   Hep C w/o coma, chronic (HCC)    Renal disorder     Past Surgical History:  Procedure Laterality Date   HAND SURGERY     HAND SURGERY      Family History  Problem Relation Age of Onset   Diabetes Mother    Diabetes Father     Social History   Tobacco Use   Smoking status: Every Day    Packs/day: 1.00    Types: Cigarettes   Smokeless tobacco: Never  Vaping Use   Vaping Use: Never used  Substance Use Topics   Alcohol use: No    Comment: rarely   Drug use: No    Medications: I have reviewed the patient's current medications. Current Facility-Administered Medications  Medication Dose Route Frequency Provider Last Rate Last Admin   ceFAZolin (ANCEF) 2-4 GM/100ML-% IVPB            ceFAZolin (ANCEF) IVPB 2g/100 mL premix  2 g Intravenous On Call to OR [name] Cagey, [name]       Chlorhexidine Gluconate Cloth 2 % PADS 6 each  6 each Topical Once [name] Cagey, [name]       And   Chlorhexidine Gluconate Cloth 2 % PADS 6 each  6 each Topical Once [name] Cagey, [name]       lactated ringers infusion   Intravenous Continuous [name] Killings, [name] 50 mL/hr at 08/02/21 0819 New Bag at 08/02/21 0819    Medications Prior to Admission  Medication Sig Dispense Refill   oxyCODONE (ROXICODONE) 5 MG immediate release tablet Take 1 tablet (5 mg total) by mouth every 4 (four) hours as needed for severe pain or breakthrough pain. 5 tablet 0    Allergies  Allergen Reactions   Tramadol Shortness Of Breath and Rash   Biaxin [Clarithromycin] Hives      ROS:  A comprehensive  review of systems was negative except for: Gastrointestinal: positive for abdominal pain  Blood pressure 131/88, pulse 88, temperature 97.9 F (36.6 C), temperature source Oral, resp. rate 17, height 5\' 11"  (1.803 m), weight 74.8 kg, SpO2 99 %. Physical Exam Vitals reviewed.  HENT:     Head: Normocephalic.     Mouth/Throat:     Mouth: Mucous membranes are moist.  Eyes:     Extraocular Movements: Extraocular movements intact.  Cardiovascular:     Rate and Rhythm: Normal rate.  Pulmonary:     Effort: Pulmonary effort is normal.  Abdominal:     General: There is no distension.     Palpations: Abdomen is soft.     Tenderness: There is no abdominal tenderness.     Hernia: A hernia is present. Hernia is present in the right inguinal area.  Musculoskeletal:        General: Normal range of motion.     Cervical back: Normal range of motion.  Skin:    General: Skin is warm.  Neurological:     General: No focal deficit present.     Mental Status:  He is alert.  Psychiatric:        Mood and Affect: Mood normal.    Results:   CLINICAL DATA:  Abdominal pain, hernia suspected   EXAM: CT ABDOMEN AND PELVIS WITHOUT CONTRAST   TECHNIQUE: Multidetector CT imaging of the abdomen and pelvis was performed following the standard protocol without IV contrast.   COMPARISON:  January 04, 2015.   FINDINGS: Lower chest: No acute abnormality.   Hepatobiliary: Unremarkable noncontrast appearance of the hepatic parenchyma. Gallbladder is unremarkable. No biliary ductal dilation.   Pancreas: Within normal limits.   Spleen: Within normal limits.   Adrenals/Urinary Tract: Adrenal glands are unremarkable. Kidneys are normal, without renal calculi, contour deforming lesion, or hydronephrosis. Bladder is unremarkable for degree of distension.   Stomach/Bowel: Stomach is grossly unremarkable. No pathologic dilation of small bowel. The appendix is not definitely visualized however there is no  pericecal inflammation. Radiopaque enteric contrast visualized to the level of the distal small bowel. Moderate volume of formed stool throughout the colon.   Vascular/Lymphatic: No significant vascular findings are present. No pathologically enlarged abdominal or pelvic lymph nodes.   Reproductive: Prostate is unremarkable.   Other: Small fat containing right inguinal hernia. No abdominopelvic ascites.   Musculoskeletal: No acute osseous abnormality.   IMPRESSION: 1. Small fat containing right inguinal hernia. 2. Moderate volume of formed stool throughout the colon, suggestive of constipation.     Electronically Signed   By: [name] Bailiff [name]   On: 11/01/2020 03:04   Assessment & Plan:  [name] [name] is a 46 y.o. male with a right inguinal hernia.   -Discussed the risk and benefits including, bleeding, infection, use of mesh, risk of recurrence, risk of nerve damage causing numbness or changes in sensation, risk of damage to the cord structures. The patient understands the risk and benefits of repair with mesh, and has decided to proceed.  We also discussed open versus laparoscopic surgery and the use of mesh. We discussed that I do open repairs with mesh, and that this is considered equivalent to laparoscopic surgery. We discussed reasons for opting for laparoscopic surgery including if a bilateral repair is needed or if a patient has a recurrence after an open repair.    All questions were answered to the satisfaction of the patient.   [name] [name] 08/02/2021, 8:53 AM

## 2021-08-02 NOTE — Op Note (Addendum)
[facility] Operative Note  08/02/21  Preoperative Diagnosis: Right inguinal hernia    Postoperative Diagnosis: Same   Procedure(s) Performed: Right inguinal hernia repair with mesh   Surgeon: Leatrice Jewels. Henreitta Leber, MD   Assistants: No qualified resident was available   Anesthesia: General endotracheal   Anesthesiologist: Molli Barrows, MD    Specimens: None    Estimated Blood Loss: Minimal   Blood Replacement: None    Complications: None   Wound Class: Clean    Operative Indications: [name] [name] is a 46 yo with a right inguinal hernia who has been having symptoms. We discussed risk of bleeding, infection, mesh use, recurrence, injury to nerves or cord structures.   Findings: Indirect hernia    Procedure: The patient was taken to the operating room and placed supine. General endotracheal anesthesia was induced. Intravenous antibiotics were administered per protocol.  A time out was preformed verifying the correct patient, procedure, site, positioning and implants.  The right groin and scrotum were prepared and draped in the usual sterile fashion.   An incision was made in a natural skin crease between the pubic tubercle and the anterior superior iliac spine.  The incision was deepened with electrocautery through Scarpa's and Camper's fascia until the aponeurosis of the external oblique was encountered.  This was cleaned and the external ring was exposed.  An incision was made in the midportion of the external oblique aponeurosis in the direction of its fibers. The ilioinguinal nerve was  identified and was protected throughout the dissection.  Flaps of the external oblique were developed cephalad and inferiorly.    The cord was identified and it was gently dissented free at the pubic tubercle and encircled with a Penrose drain.  Attention was then directed at the anteromedial aspect of the cord, where an indirect hernia sac was identified.  The sac was  carefully dissected free from the cord down to the level of the internal ring.  The vas and testicular vessels were identified and protected from harm.  Once the sac was dissected free from the cords, the Penrose was placed around the cord which was retracted inferiorly out of the field of view.  A cord lipoma was excised. The hernia was reduced into the internal ring without difficulty.  A medium Perfix Plug was placed into the defect and filled the space.  Attention was then turned to the floor of the canal, which was grossly weakened without any defined defect or sac.  The Perfix Mesh Patch was sutured to the inguinal ligament inferiorly starting at the pubic tubercle using 2-0 Novafil interrupted sutures.  The mesh was sutured superiorly to the conjoint tendon using 2-0 Novafil interrupted sutures.  Care was taken to ensure the mesh was placed in a relaxed fashion to avoid excessive tension and no neurovascular structures were caught in the repair.  Laterally the tails of the mesh were crossed and the internal ring was recreated, allowing for passage of cords without tension.  Xaracoll was placed in layers throughout the repair.   Hemostasis was adequate.  The Penrose was removed.  The external oblique aponeurosis was closed with a 2-0 Vicryl suture in a running fashion, taking care to not catch the ilioinguinal nerve in the suture line.  Scarpa's fashion was closed with 3-0 Vicryl interrupted sutures. The skin was closed with a subcuticular 4-0 Monocryl suture.  Dermabond was applied.   The testis was gently pulled down into its anatomic position in the scrotum.  The patient  tolerated the procedure well and was taken to the PACU in stable condition. All counts were correct at the end of the case.        Algis Greenhouse, MD [facility] [address] [place] [place] [place], [place] [address] [phone] (office)

## 2021-08-02 NOTE — Progress Notes (Addendum)
[facility]  Updated his mother. Rx sent to [facility]. Will see 3/15 for  follow up.   Algis Greenhouse, MD [facility] [address] [place] [place], [place] [address] [phone] (office)

## 2021-08-02 NOTE — Anesthesia Postprocedure Evaluation (Signed)
Anesthesia Post Note  Patient: [name] [name]  Procedure(s) Performed: HERNIA REPAIR INGUINAL ADULT WITH MESH (Right: Inguinal)  Patient location during evaluation: Phase II Anesthesia Type: General Level of consciousness: awake and alert and oriented Pain management: pain level controlled Vital Signs Assessment: post-procedure vital signs reviewed and stable Respiratory status: spontaneous breathing, nonlabored ventilation and respiratory function stable Cardiovascular status: stable and blood pressure returned to baseline Postop Assessment: no apparent nausea or vomiting Anesthetic complications: no   No notable events documented.   Last Vitals:  Vitals:   08/02/21 1132 08/02/21 1148  BP: 112/83 124/77  Pulse: 76 84  Resp: 12 15  Temp:  36.4 C  SpO2: 97% 100%    Last Pain:  Vitals:   08/02/21 1148  TempSrc: Oral  PainSc: 2                  Cheney Gosch C Katrinia Straker

## 2021-08-06 ENCOUNTER — Encounter (HOSPITAL_COMMUNITY): Payer: Self-pay | Admitting: General Surgery

## 2021-08-12 ENCOUNTER — Telehealth (INDEPENDENT_AMBULATORY_CARE_PROVIDER_SITE_OTHER): Payer: Medicaid Other | Admitting: *Deleted

## 2021-08-12 DIAGNOSIS — Z09 Encounter for follow-up examination after completed treatment for conditions other than malignant neoplasm: Secondary | ICD-10-CM

## 2021-08-12 NOTE — Telephone Encounter (Signed)
Received call from patient.   Reports that he is having burning in his feet and is requesting Gabapentin.   Also requested refill on Oxycodone. Last refill 08/02/2021.   Please advise.

## 2021-08-13 MED ORDER — GABAPENTIN 100 MG PO CAPS: 100.0000 mg | ORAL_CAPSULE | Freq: Three times a day (TID) | ORAL | 0 refills | Status: DC

## 2021-08-13 MED ORDER — OXYCODONE HCL 5 MG PO TABS: 5.0000 mg | ORAL_TABLET | ORAL | 0 refills | Status: DC | PRN

## 2021-08-13 NOTE — Telephone Encounter (Signed)
[facility]  One time order for both written. Was on gabapentin preop from PCP. Did 100 mg tid for 1 month.  Roxicodone 5 mg q 4 PRN # 10.  Needs to use ibuprofen and tylenol and wean off above. If needs longer term gabapentin will need to see his PCP.   Algis Greenhouse, MD [facility] [address] [place] [place], [place] [address] [phone] (office)

## 2021-08-13 NOTE — Telephone Encounter (Signed)
Multiple calls placed to patient with no answer and no return call.   Message to be closed.  

## 2021-08-13 NOTE — Telephone Encounter (Signed)
Call placed to patient. LMTRC.  

## 2021-08-28 ENCOUNTER — Encounter: Payer: Medicaid Other | Admitting: General Surgery

## 2021-10-10 IMAGING — CT CT ABD-PELV W/O CM
2 of 4 series · 17 of 46 positions shown, 19 images · non-contrast
Comparison: January 04, 2015.

CLINICAL DATA: Abdominal pain, hernia suspected

EXAM:
CT ABDOMEN AND PELVIS WITHOUT CONTRAST
TECHNIQUE: Multidetector CT imaging of the abdomen and pelvis was performed
following the standard protocol without IV contrast.

[Series 2: axial st · axial · 0.65mm/px · z∈[+919,+1274]mm · 14 of 81 slices shown, 16 images]
[im 5/81  soft-tissue]
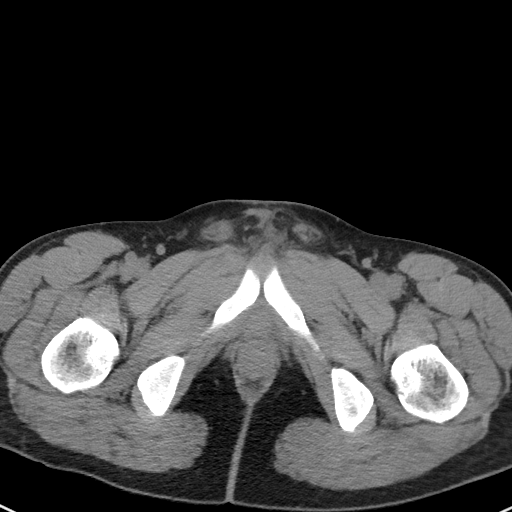
[im 5/81  bone]
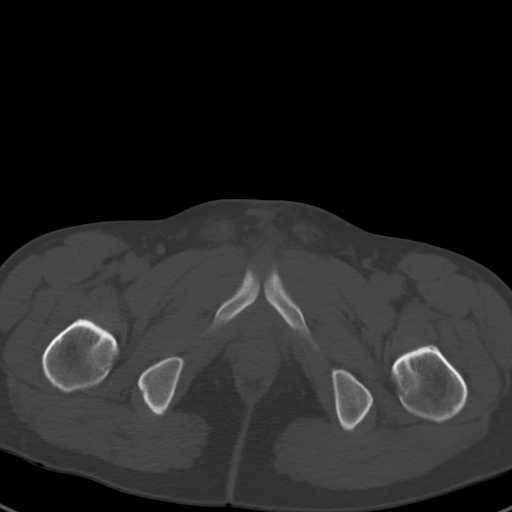
[im 9/81  soft-tissue]
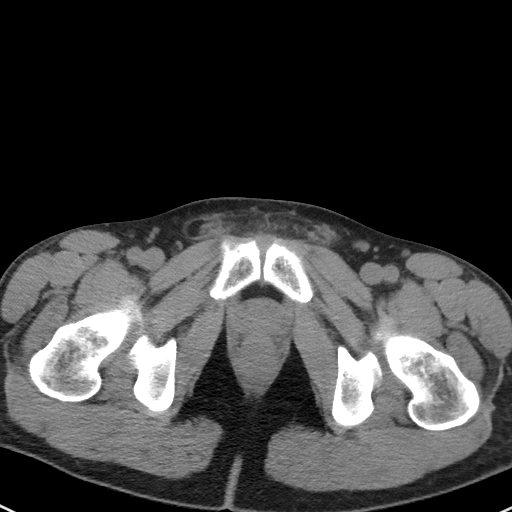
[im 18/81  soft-tissue]
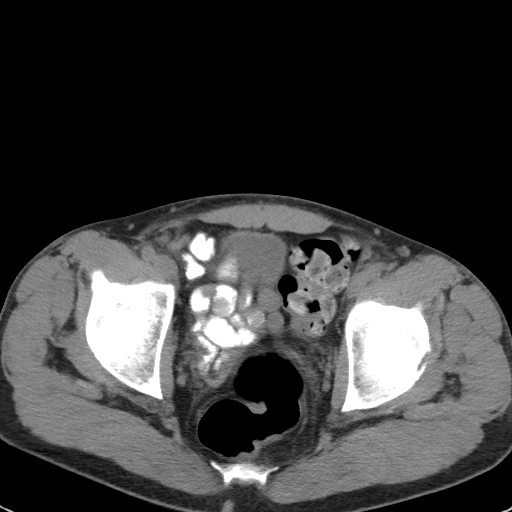
[im 23/81  soft-tissue]
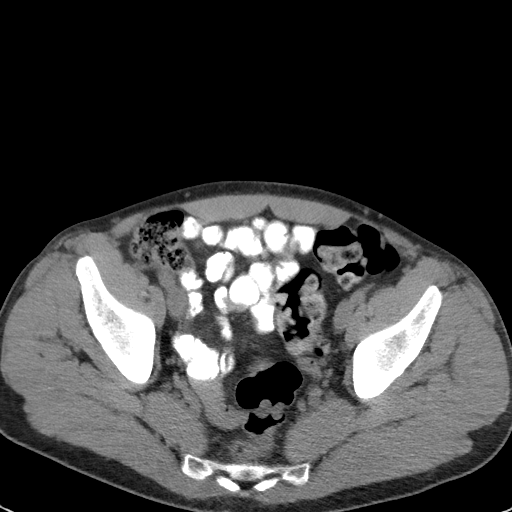
[im 27/81  soft-tissue]
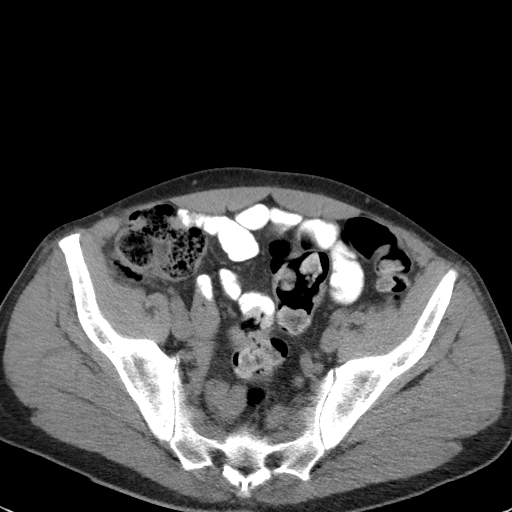
[im 32/81  soft-tissue]
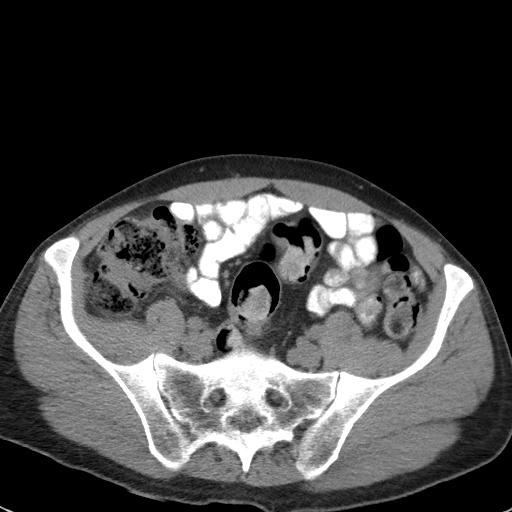
[im 36/81  soft-tissue]
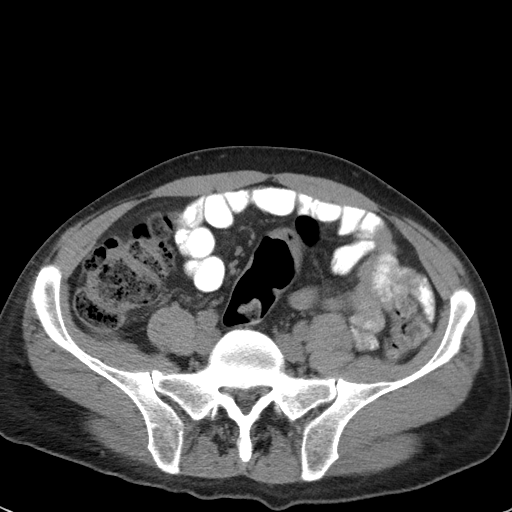
[im 45/81  soft-tissue]
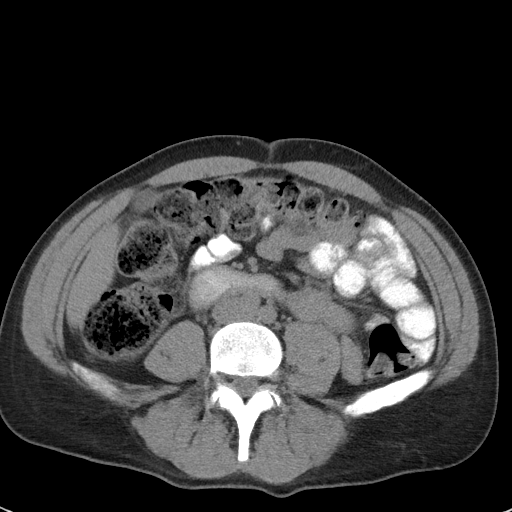
[im 49/81  soft-tissue]
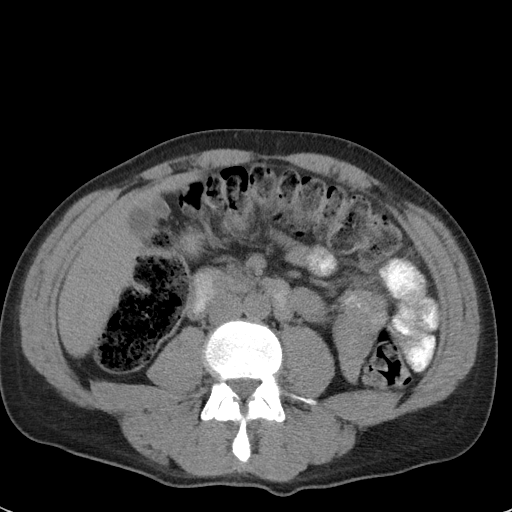
[im 49/81  bone]
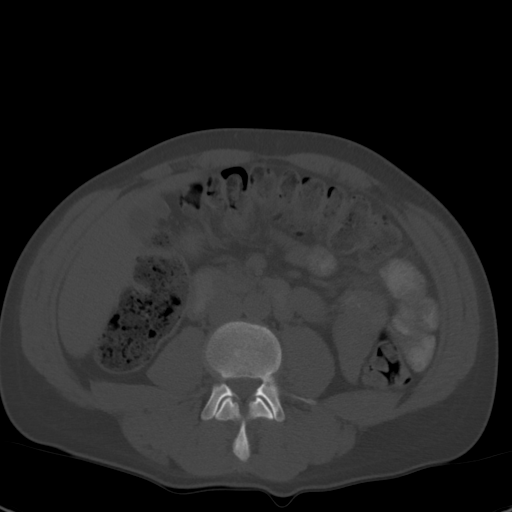
[im 54/81  soft-tissue]
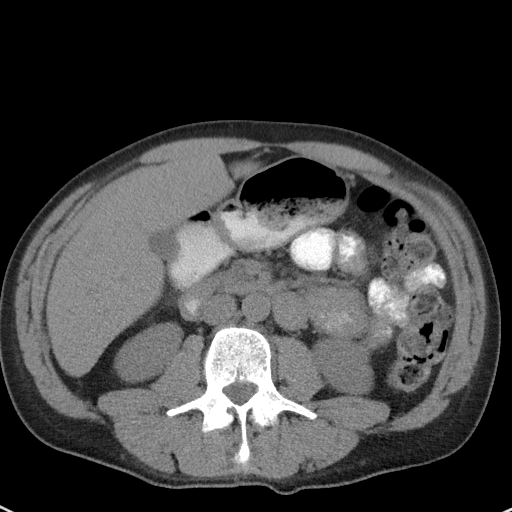
[im 58/81  soft-tissue]
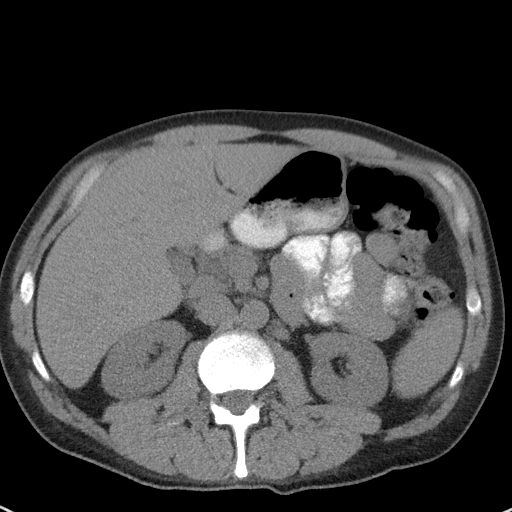
[im 63/81  soft-tissue]
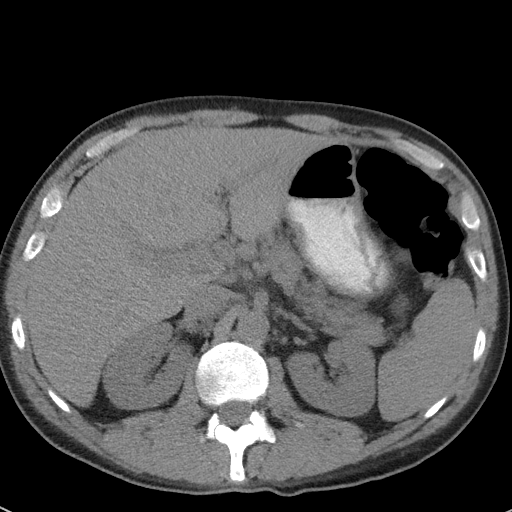
[im 72/81  soft-tissue]
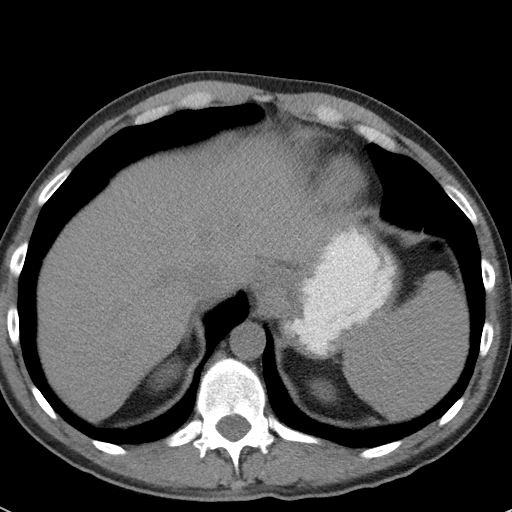
[im 76/81  soft-tissue]
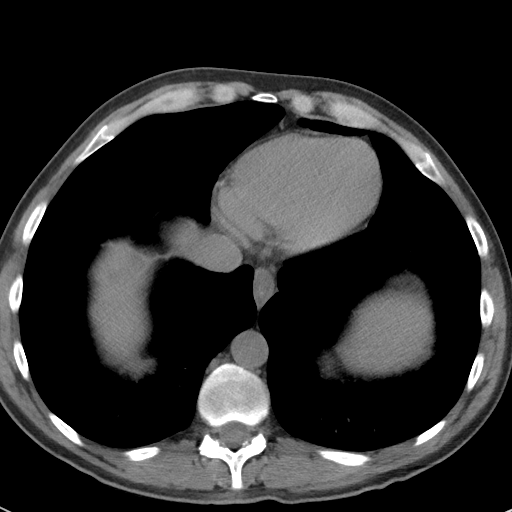

[Series 5: coronal st · coronal · 0.78mm/px · 3 of 90 slices shown]
[im 30/90  soft-tissue]
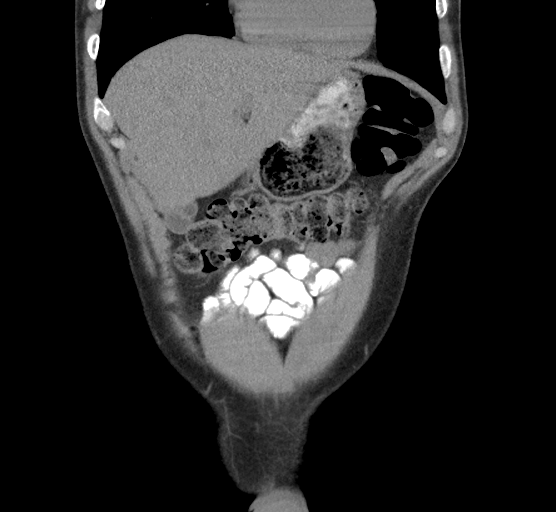
[im 40/90  soft-tissue]
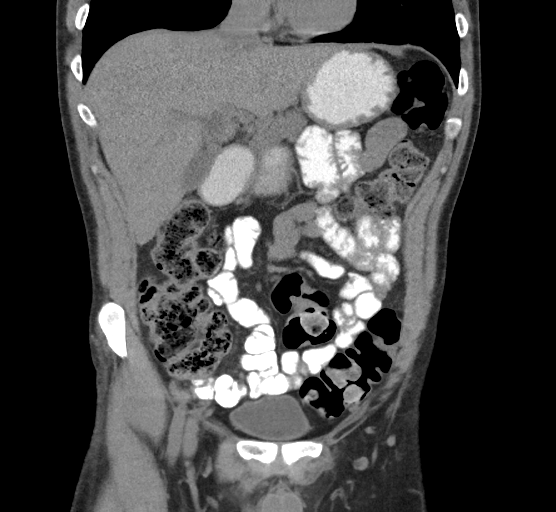
[im 50/90  soft-tissue]
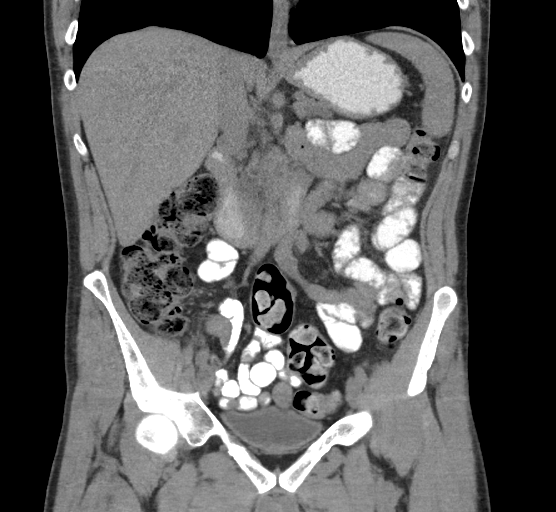

[17 of 46 positions shown; findings below may reference images not displayed]

FINDINGS: Lower chest: No acute abnormality.

Hepatobiliary: Unremarkable noncontrast appearance of the hepatic
parenchyma. Gallbladder is unremarkable. No biliary ductal dilation.

Pancreas: Within normal limits.

Spleen: Within normal limits.

Adrenals/Urinary Tract: Adrenal glands are unremarkable. Kidneys are
normal, without renal calculi, contour deforming lesion, or
hydronephrosis. Bladder is unremarkable for degree of distension.

Stomach/Bowel: Stomach is grossly unremarkable. No pathologic
dilation of small bowel. The appendix is not definitely visualized
however there is no pericecal inflammation. Radiopaque enteric
contrast visualized to the level of the distal small bowel. Moderate
volume of formed stool throughout the colon.

Vascular/Lymphatic: No significant vascular findings are present. No
pathologically enlarged abdominal or pelvic lymph nodes.

Reproductive: Prostate is unremarkable.

Other: Small fat containing right inguinal hernia. No abdominopelvic
ascites.

Musculoskeletal: No acute osseous abnormality.
IMPRESSION: 1. Small fat containing right inguinal hernia.
2. Moderate volume of formed stool throughout the colon, suggestive
of constipation.

## 2023-05-07 ENCOUNTER — Emergency Department (HOSPITAL_COMMUNITY)
Admission: EM | Admit: 2023-05-07 | Discharge: 2023-05-08 | Disposition: A | Discharge status: Other Acute Inpatient Hospital | Payer: Commercial Managed Care - HMO | Attending: Emergency Medicine | Admitting: Emergency Medicine

## 2023-05-07 ENCOUNTER — Other Ambulatory Visit: Payer: Self-pay

## 2023-05-07 ENCOUNTER — Encounter (HOSPITAL_COMMUNITY): Payer: Self-pay

## 2023-05-07 DIAGNOSIS — F1910 Other psychoactive substance abuse, uncomplicated: Secondary | ICD-10-CM

## 2023-05-07 DIAGNOSIS — F411 Generalized anxiety disorder: Secondary | ICD-10-CM | POA: Insufficient documentation

## 2023-05-07 DIAGNOSIS — R45851 Suicidal ideations: Secondary | ICD-10-CM | POA: Insufficient documentation

## 2023-05-07 LAB — CBC
HCT: 36.4 % — ABNORMAL LOW (ref 39.0–52.0)
Hemoglobin: 12.5 g/dL — ABNORMAL LOW (ref 13.0–17.0)
MCH: 29.6 pg (ref 26.0–34.0)
MCHC: 34.3 g/dL (ref 30.0–36.0)
MCV: 86.1 fL (ref 80.0–100.0)
Platelets: 256 10*3/uL (ref 150–400)
RBC: 4.23 MIL/uL (ref 4.22–5.81)
RDW: 12.9 % (ref 11.5–15.5)
WBC: 5.4 10*3/uL (ref 4.0–10.5)
nRBC: 0 % (ref 0.0–0.2)

## 2023-05-07 LAB — COMPREHENSIVE METABOLIC PANEL
ALT: 15 U/L (ref 0–44)
AST: 16 U/L (ref 15–41)
Albumin: 3.5 g/dL (ref 3.5–5.0)
Alkaline Phosphatase: 96 U/L (ref 38–126)
Anion gap: 11 (ref 5–15)
BUN: 9 mg/dL (ref 6–20)
CO2: 24 mmol/L (ref 22–32)
Calcium: 9.3 mg/dL (ref 8.9–10.3)
Chloride: 101 mmol/L (ref 98–111)
Creatinine, Ser: 0.71 mg/dL (ref 0.61–1.24)
GFR, Estimated: >60 mL/min (ref >60)
Glucose, Bld: 143 mg/dL — ABNORMAL HIGH (ref 70–99)
Potassium: 3.8 mmol/L (ref 3.5–5.1)
Sodium: 136 mmol/L (ref 135–145)
Total Bilirubin: 0.6 mg/dL (ref <1.2)
Total Protein: 8.2 g/dL — ABNORMAL HIGH (ref 6.5–8.1)

## 2023-05-07 LAB — ETHANOL: Alcohol, Ethyl (B): <10 mg/dL (ref <10)

## 2023-05-07 LAB — SALICYLATE LEVEL: Salicylate Lvl: <7 mg/dL — ABNORMAL LOW (ref 7.0–30.0)

## 2023-05-07 MED ORDER — LORAZEPAM 1 MG PO TABS
1.0000 mg | ORAL_TABLET | ORAL | Status: AC | PRN
  Administered 2023-05-07: 1 mg via ORAL
  Filled 2023-05-07: qty 1

## 2023-05-07 MED ORDER — NICOTINE POLACRILEX 2 MG MT GUM: 2.0000 mg | CHEWING_GUM | OROMUCOSAL | Status: DC | PRN

## 2023-05-07 MED ORDER — NICOTINE 21 MG/24HR TD PT24
21.0000 mg | MEDICATED_PATCH | Freq: Once | TRANSDERMAL | Status: DC
  Administered 2023-05-07: 21 mg via TRANSDERMAL
  Filled 2023-05-07: qty 1

## 2023-05-07 MED ORDER — ZIPRASIDONE MESYLATE 20 MG IM SOLR: 20.0000 mg | INTRAMUSCULAR | Status: DC | PRN

## 2023-05-07 MED ORDER — OLANZAPINE 5 MG PO TBDP: 5.0000 mg | ORAL_TABLET | Freq: Three times a day (TID) | ORAL | Status: DC | PRN

## 2023-05-07 MED ORDER — ONDANSETRON 4 MG PO TBDP
4.0000 mg | ORAL_TABLET | Freq: Once | ORAL | Status: AC
  Administered 2023-05-07: 4 mg via ORAL
  Filled 2023-05-07: qty 1

## 2023-05-07 NOTE — Consult Note (Signed)
[facility] ED ASSESSMENT   Reason for Consult: SI Referring Physician:  Dr. Estell Harpin Patient Identification: [name] [name] MRN:  [id number] ED Chief Complaint: Polysubstance abuse [facility])  Diagnosis:  Principal Problem:   Polysubstance abuse (HCC) Active Problems:   GAD (generalized anxiety disorder)   ED Assessment Time Calculation: Start Time: 1745 Stop Time: 1820 Total Time in Minutes (Assessment Completion): 35   Subjective: [name] [name] is a 47 y.o. male patient admitted with history of substance abuse who presents to the emergency department with suicidal ideations.   HPI:  [name] [name], 47 y.o., male patient seen face to face by this provider, consulted with Dr. Lucianne Muss; and chart reviewed on 05/07/23.  On evaluation DEMARCUS KINDIG reports that he has been abusing substances since the age of 59, states he has been off-and-on on all types of "pills."States he started on Fentanyl about 3 to 4 years ago, and lately he has becoming increasingly worse, states he  uses about a gram or 2 almost every day and he will get his hands on it, he states whenever he can get his hands on, he uses it.  Patient states that he lives wherever he can, he currently does not work.  Patient endorses using "all types of pills "methamphetamines occasionally, and mostly fentanyl and cocaine.  Patient denies using any alcohol.  Patient UDS positive for cocaine and benzodiazepine.  BAL less than 10.  Patient is unable to tell me where he got the benzodiazepine, states he uses them occasionally also.  Patient states that he is having a hard time focusing and concentrating during the assessment, states that he recently lost his girlfriend, stepdaughter, and mother all within a year and a half of each other, states they passed away.  Patient currently states that he would rather be dead since feeling the way he is currently feeling, withdrawing.  Patient states he last used fentanyl a few days ago.  Patient currently denies  HI/AVH.  Patient states he has tried to overdose a few times in his life, but currently wants help with his substance abuse.  He states that he was on methadone about 1 to 2 years ago and he has been to a few detox facilities, unable to recall when, but states he went to [facility].  Patient denies being on any psychiatric medications, states he does not have any psychiatric diagnosis.  Patient states his appetite and sleep have been poor, due to substance use and not really having anywhere to stay.  During evaluation JAHSIAH SIEGLER is sitting in the assessment room, in a chair and is bent over in the chair, appears to be pain in moderate distress. He is alert, oriented x 3, calm, cooperative and attentive. His mood is flat/depressed with congruent affect.  He has normal speech, and behavior.  Objectively there is no evidence of psychosis/mania or delusional thinking.  Patient is able to converse coherently, goal directed thoughts, no distractibility, or pre-occupation. He denies self-harm/homicidal ideation, psychosis, and paranoia.  Patient currently endorses passive SI, stating he would rather be dead than the shape that he is in now, no plans.  Per EDP note "history of substance abuse who presents to the emergency department with suicidal ideations. Patient tried to overdose with fentanyl last night and attempt to kill himself. Patient states that he has been using for the last 20+ years. When asked whether or not the patient wants to harm self today he just concerned about "getting high."   Past  Psychiatric History: anxiety, polysubstance abuse  Risk to Self or Others: Risk to Self:  Passive SI Risk to Others:  No  Prior Inpatient Therapy:  Yes  Prior Outpatient Therapy:  No   [place] Scale:  Flowsheet Row ED from 05/07/2023 in [facility] Emergency Department at [facility] Admission (Discharged) from 08/02/2021 in [facility] Pre-Admission Testing 60 from 07/31/2021 in [place]  [facility]  C-SSRS RISK CATEGORY High Risk No Risk No Risk       AIMS:  , , ,  ,   ASAM:    Substance Abuse:     Past Medical History:  Past Medical History:  Diagnosis Date   Hep C w/o coma, chronic (HCC)    Renal disorder     Past Surgical History:  Procedure Laterality Date   HAND SURGERY     HAND SURGERY     INGUINAL HERNIA REPAIR Right 08/02/2021   Procedure: HERNIA REPAIR INGUINAL ADULT WITH MESH;  Surgeon: Lucretia Roers, MD;  Location: [facility];  Service: General;  Laterality: Right;   Family History:  Family History  Problem Relation Age of Onset   Diabetes Mother    Diabetes Father     Social History:  Social History   Substance and Sexual Activity  Alcohol Use No   Comment: rarely     Social History   Substance and Sexual Activity  Drug Use No    Social History   Socioeconomic History   Marital status: Legally Separated    Spouse name: Not on file   Number of children: Not on file   Years of education: Not on file   Highest education level: Not on file  Occupational History   Not on file  Tobacco Use   Smoking status: Every Day    Current packs/day: 1.00    Types: Cigarettes   Smokeless tobacco: Never  Vaping Use   Vaping status: Never Used  Substance and Sexual Activity   Alcohol use: No    Comment: rarely   Drug use: No   Sexual activity: Never  Other Topics Concern   Not on file  Social History Narrative   Not on file   Social Determinants of Health   Financial Resource Strain: Not on file  Food Insecurity: Not on file  Transportation Needs: Not on file  Physical Activity: Not on file  Stress: Not on file  Social Connections: Not on file      Allergies:   Allergies  Allergen Reactions   Tramadol Shortness Of Breath and Rash   Biaxin [Clarithromycin] Hives    Labs:  Results for orders placed or performed during the hospital encounter of 05/07/23 (from the past 48 hour(s))  cbc     Status: Abnormal    Collection Time: 05/07/23  6:19 PM  Result Value Ref Range   WBC 5.4 4.0 - 10.5 K/uL   RBC 4.23 4.22 - 5.81 MIL/uL   Hemoglobin 12.5 (L) 13.0 - 17.0 g/dL   HCT 13.0 (L) 86.5 - 78.4 %   MCV 86.1 80.0 - 100.0 fL   MCH 29.6 26.0 - 34.0 pg   MCHC 34.3 30.0 - 36.0 g/dL   RDW 69.6 29.5 - 28.4 %   Platelets 256 150 - 400 K/uL   nRBC 0.0 0.0 - 0.2 %    Comment: Performed at [facility], [address] [place], [place] [address]    Current Facility-Administered Medications  Medication Dose Route Frequency Provider  Last Rate Last Admin   OLANZapine zydis (ZYPREXA) disintegrating tablet 5 mg  5 mg Oral Q8H PRN Motley-Mangrum, Maitland Muhlbauer A, PMHNP       And   LORazepam (ATIVAN) tablet 1 mg  1 mg Oral PRN Motley-Mangrum, Tanita Palinkas A, PMHNP       And   ziprasidone (GEODON) injection 20 mg  20 mg Intramuscular PRN Motley-Mangrum, Elyzabeth Goatley A, PMHNP       nicotine polacrilex (NICORETTE) gum 2 mg  2 mg Oral PRN Motley-Mangrum, Floreine Kingdon A, PMHNP       No current outpatient medications on file.    Musculoskeletal: Strength & Muscle Tone: within normal limits Gait & Station: normal Patient leans: N/A   Psychiatric Specialty Exam: Presentation  General Appearance:  Disheveled  Eye Contact: Minimal  Speech: Clear and Coherent  Speech Volume: Decreased  Handedness: Right   Mood and Affect  Mood: Depressed; Hopeless  Affect: Restricted; Flat   Thought Process  Thought Processes: Coherent  Descriptions of Associations:Intact  Orientation:Full (Time, Place and Person)  Thought Content:WDL  History of Schizophrenia/Schizoaffective disorder:No data recorded Duration of Psychotic Symptoms:No data recorded Hallucinations:Hallucinations: None  Ideas of Reference:None  Suicidal Thoughts:Suicidal Thoughts: Yes, Passive  Homicidal Thoughts:Homicidal Thoughts: No   Sensorium  Memory: Recent Fair; Immediate Good  Judgment: Poor  Insight: Fair   Producer, television/film/video: Fair  Attention Span: Fair  Recall: [employer] of Knowledge: Fair  Language: Fair   Psychomotor Activity  Psychomotor Activity: Psychomotor Activity: Normal   Assets  Assets: Communication Skills; Desire for Improvement; Social Support    Sleep  Sleep: Sleep: Poor   Physical Exam: Physical Exam Vitals and nursing note reviewed. Exam conducted with a chaperone present.  Neurological:     Mental Status: He is alert.  Psychiatric:        Attention and Perception: Attention normal.        Mood and Affect: Mood is anxious. Affect is flat.        Speech: Speech normal.        Behavior: Behavior is cooperative.        Thought Content: Thought content includes suicidal ideation.        Cognition and Memory: Memory normal.        Judgment: Judgment is impulsive and inappropriate.    Review of Systems  Constitutional: Negative.   Psychiatric/Behavioral:  Positive for substance abuse.    Blood pressure (!) 140/91, pulse 72, temperature (!) 97.5 F (36.4 C), resp. rate 14, height 5\' 11"  (1.803 m), weight 72.6 kg, SpO2 100%. Body mass index is 22.32 kg/m.   Medical Decision Making: Recommend patient be transferred to [facility] for detox treatment. EDP, RN, and LCSW notified of disposition.     Alona Bene, PMHNP 05/07/2023 6:49 PM

## 2023-05-07 NOTE — ED Notes (Signed)
Pt talking to TTS 

## 2023-05-07 NOTE — ED Notes (Signed)
Pt belongings in locker. 

## 2023-05-07 NOTE — ED Notes (Signed)
Pt back from TTS 

## 2023-05-07 NOTE — ED Triage Notes (Signed)
Pt stated that he was trying to kill himself with fentanyl  Having suicidal thoughts x2 years More frequently over the last few months   Pt stated that he took fentanyl yesterday in an attempt to kill himself. Stated that he took almost a gram  Pt slow to answer questions in triage

## 2023-05-07 NOTE — ED Notes (Signed)
See triage notes. Pt states "my soul is broken". Will not respond to most questions. Withdrawn. Pt did not want meal at this time.

## 2023-05-07 NOTE — ED Provider Notes (Addendum)
[place] EMERGENCY DEPARTMENT AT [facility] Provider Note   CSN: [id number] Arrival date & time: 05/07/23  1600     History Chief Complaint  Patient presents with   Suicidal    [name] [name] is a 47 y.o. male patient with history of substance abuse who presents to the emergency department with suicidal ideations.  Patient tried to overdose with fentanyl last night and attempt to kill himself.  Patient states that he has been using for the last 20+ years.  When asked whether or not the patient wants to harm self today he just concerned about "getting high."  He currently has no complaints.  HPI     Home Medications Prior to Admission medications   Medication Sig Start Date End Date Taking? Authorizing Provider  gabapentin (NEURONTIN) 100 MG capsule Take 1 capsule (100 mg total) by mouth 3 (three) times daily. 08/13/21 09/12/21  Lucretia Roers, MD  oxyCODONE (ROXICODONE) 5 MG immediate release tablet Take 1 tablet (5 mg total) by mouth every 4 (four) hours as needed for severe pain or breakthrough pain. 08/13/21   Lucretia Roers, MD      Allergies    Tramadol and Biaxin [clarithromycin]    Review of Systems   Review of Systems  All other systems reviewed and are negative.   Physical Exam Updated Vital Signs BP (!) 140/91 (BP Location: Right Arm)   Pulse 72   Temp (!) 97.5 F (36.4 C)   Resp 14   Ht 5\' 11"  (1.803 m)   Wt 72.6 kg   SpO2 100%   BMI 22.32 kg/m  Physical Exam Vitals and nursing note reviewed.  Constitutional:      General: He is not in acute distress.    Appearance: Normal appearance.  HENT:     Head: Normocephalic and atraumatic.  Eyes:     General:        Right eye: No discharge.        Left eye: No discharge.  Cardiovascular:     Comments: Regular rate and rhythm.  S1/S2 are distinct without any evidence of murmur, rubs, or gallops.  Radial pulses are 2+ bilaterally.  Dorsalis pedis pulses are 2+ bilaterally.  No evidence of  pedal edema. Pulmonary:     Comments: Clear to auscultation bilaterally.  Normal effort.  No respiratory distress.  No evidence of wheezes, rales, or rhonchi heard throughout. Abdominal:     General: Abdomen is flat. Bowel sounds are normal. There is no distension.     Tenderness: There is no abdominal tenderness. There is no guarding or rebound.  Musculoskeletal:        General: Normal range of motion.     Cervical back: Neck supple.  Skin:    General: Skin is warm and dry.     Findings: No rash.  Neurological:     General: No focal deficit present.     Mental Status: He is alert.  Psychiatric:        Mood and Affect: Mood normal.        Behavior: Behavior normal.     ED Results / Procedures / Treatments   Labs (all labs ordered are listed, but only abnormal results are displayed) Labs Reviewed  COMPREHENSIVE METABOLIC PANEL  ETHANOL  CBC  RAPID URINE DRUG SCREEN, HOSP PERFORMED  SALICYLATE LEVEL    EKG None  Radiology No results found.  Procedures Procedures    Medications Ordered in ED Medications - No  data to display  ED Course/ Medical Decision Making/ A&P Clinical Course as of 05/07/23 1853  Thu May 07, 2023  4403 cbc(!) Normal.  [CF]    Clinical Course User Index [CF] Teressa Lower, PA-C   {   Click here for ABCD2, HEART and other calculators  Medical Decision Making MADELYN LEET is a 47 y.o. male patient who presents to the emergency department today for further evaluation of suicidal ideations.  Concern that the patient did actively try to hurt himself last night by time to intentionally overdose I will plan to IVC the patient.  Patient cleared for TTS.  Given the patient's clinical symptoms and psychiatric condition I do feel the patient would meet inpatient criteria at this time.  He is medically cleared from my standpoint. Disposition to be made by oncoming provider pending TTS consult.  Amount and/or Complexity of Data Reviewed Labs:  ordered. Decision-making details documented in ED Course. ECG/medicine tests: ordered.    Final Clinical Impression(s) / ED Diagnoses Final diagnoses:  None    Rx / DC Orders ED Discharge Orders     None         Teressa Lower, PA-C 05/07/23 1853    Teressa Lower, PA-C 05/07/23 Lynelle Smoke    Bethann Berkshire, MD 05/08/23 1243

## 2023-05-07 NOTE — Progress Notes (Addendum)
[facility] ([facility] Based Crisis [facility]) review for placement  This CSW has requested that pt be reviewed for [facility] ([facility] Based Crisis [facility]) Address: [address], [place], [place] [address]   -CSW will assist and follow with recommendation.   Care Team notified: Alona Bene, PMHNP, [facility] Malva Limes, RN, Day [facility] Antoinette [place], RN, [facility] Fransico Michael, RN, Lavonna Monarch, Chinwendu Royston Bake, NP,Christy Gilman, RN  Maryjean Ka, MSW, [facility] 05/07/2023 9:47 PM

## 2023-05-07 NOTE — ED Notes (Signed)
Pt changed into scrubs and security wanded pt

## 2023-05-08 ENCOUNTER — Inpatient Hospital Stay
Admission: AD | Admit: 2023-05-08 | Discharge: 2023-05-13 | DRG: 885 | Disposition: A | Discharge status: Home / Self Care | Payer: Commercial Managed Care - HMO | Source: Intra-hospital | Attending: Psychiatry | Admitting: Psychiatry

## 2023-05-08 ENCOUNTER — Encounter: Payer: Self-pay | Admitting: Nurse Practitioner

## 2023-05-08 DIAGNOSIS — R45851 Suicidal ideations: Secondary | ICD-10-CM | POA: Diagnosis present

## 2023-05-08 DIAGNOSIS — F332 Major depressive disorder, recurrent severe without psychotic features: Principal | ICD-10-CM | POA: Diagnosis present

## 2023-05-08 DIAGNOSIS — Z5900 Homelessness unspecified: Secondary | ICD-10-CM | POA: Diagnosis not present

## 2023-05-08 DIAGNOSIS — G4700 Insomnia, unspecified: Secondary | ICD-10-CM | POA: Diagnosis present

## 2023-05-08 DIAGNOSIS — Z9151 Personal history of suicidal behavior: Secondary | ICD-10-CM

## 2023-05-08 DIAGNOSIS — Z881 Allergy status to other antibiotic agents status: Secondary | ICD-10-CM

## 2023-05-08 DIAGNOSIS — Z79899 Other long term (current) drug therapy: Secondary | ICD-10-CM

## 2023-05-08 DIAGNOSIS — F17210 Nicotine dependence, cigarettes, uncomplicated: Secondary | ICD-10-CM | POA: Diagnosis present

## 2023-05-08 DIAGNOSIS — Z62819 Personal history of unspecified abuse in childhood: Secondary | ICD-10-CM | POA: Diagnosis not present

## 2023-05-08 DIAGNOSIS — F1123 Opioid dependence with withdrawal: Secondary | ICD-10-CM | POA: Diagnosis not present

## 2023-05-08 DIAGNOSIS — B182 Chronic viral hepatitis C: Secondary | ICD-10-CM | POA: Diagnosis present

## 2023-05-08 DIAGNOSIS — Z888 Allergy status to other drugs, medicaments and biological substances status: Secondary | ICD-10-CM | POA: Diagnosis not present

## 2023-05-08 DIAGNOSIS — F419 Anxiety disorder, unspecified: Secondary | ICD-10-CM | POA: Diagnosis present

## 2023-05-08 DIAGNOSIS — F1910 Other psychoactive substance abuse, uncomplicated: Secondary | ICD-10-CM | POA: Diagnosis present

## 2023-05-08 MED ORDER — ONDANSETRON 4 MG PO TBDP
4.0000 mg | ORAL_TABLET | Freq: Four times a day (QID) | ORAL | Status: DC | PRN
  Administered 2023-05-08 – 2023-05-12 (×5): 4 mg via ORAL
  Filled 2023-05-08 (×6): qty 1

## 2023-05-08 MED ORDER — HALOPERIDOL 5 MG PO TABS: 5.0000 mg | ORAL_TABLET | Freq: Three times a day (TID) | ORAL | Status: DC | PRN

## 2023-05-08 MED ORDER — ALUM & MAG HYDROXIDE-SIMETH 200-200-20 MG/5ML PO SUSP
30.0000 mL | ORAL | Status: DC | PRN
Start: 2023-05-08 — End: 2023-05-14
  Administered 2023-05-10 – 2023-05-13 (×5): 30 mL via ORAL
  Filled 2023-05-08 (×5): qty 30

## 2023-05-08 MED ORDER — ACETAMINOPHEN 325 MG PO TABS: 650.0000 mg | ORAL_TABLET | Freq: Four times a day (QID) | ORAL | Status: DC | PRN

## 2023-05-08 MED ORDER — DICYCLOMINE HCL 20 MG PO TABS
20.0000 mg | ORAL_TABLET | Freq: Four times a day (QID) | ORAL | Status: AC | PRN
  Administered 2023-05-08 – 2023-05-12 (×4): 20 mg via ORAL
  Filled 2023-05-08 (×5): qty 1

## 2023-05-08 MED ORDER — NICOTINE POLACRILEX 2 MG MT GUM
2.0000 mg | CHEWING_GUM | OROMUCOSAL | Status: DC | PRN
  Administered 2023-05-08 – 2023-05-12 (×15): 2 mg via ORAL
  Filled 2023-05-08 (×17): qty 1

## 2023-05-08 MED ORDER — HYDROXYZINE HCL 25 MG PO TABS
25.0000 mg | ORAL_TABLET | Freq: Four times a day (QID) | ORAL | Status: AC | PRN
  Administered 2023-05-08 – 2023-05-10 (×5): 25 mg via ORAL
  Filled 2023-05-08 (×6): qty 1

## 2023-05-08 MED ORDER — NAPROXEN 500 MG PO TABS
500.0000 mg | ORAL_TABLET | Freq: Two times a day (BID) | ORAL | Status: DC | PRN
  Administered 2023-05-08 – 2023-05-09 (×2): 500 mg via ORAL
  Filled 2023-05-08 (×2): qty 1

## 2023-05-08 MED ORDER — GABAPENTIN 300 MG PO CAPS
300.0000 mg | ORAL_CAPSULE | Freq: Three times a day (TID) | ORAL | Status: DC
  Administered 2023-05-08 – 2023-05-09 (×2): 300 mg via ORAL
  Filled 2023-05-08 (×2): qty 1

## 2023-05-08 MED ORDER — HALOPERIDOL LACTATE 5 MG/ML IJ SOLN: 5.0000 mg | Freq: Three times a day (TID) | INTRAMUSCULAR | Status: DC | PRN

## 2023-05-08 MED ORDER — LORAZEPAM 2 MG/ML IJ SOLN: 2.0000 mg | Freq: Three times a day (TID) | INTRAMUSCULAR | Status: DC | PRN

## 2023-05-08 MED ORDER — DIPHENHYDRAMINE HCL 50 MG/ML IJ SOLN: 50.0000 mg | Freq: Three times a day (TID) | INTRAMUSCULAR | Status: DC | PRN

## 2023-05-08 MED ORDER — LOPERAMIDE HCL 2 MG PO CAPS
2.0000 mg | ORAL_CAPSULE | ORAL | Status: AC | PRN
  Administered 2023-05-10: 2 mg via ORAL
  Filled 2023-05-08: qty 1

## 2023-05-08 MED ORDER — CLONIDINE HCL 0.1 MG PO TABS
0.1000 mg | ORAL_TABLET | ORAL | Status: AC
  Administered 2023-05-11: 0.1 mg via ORAL
  Filled 2023-05-08 (×2): qty 1

## 2023-05-08 MED ORDER — CLONIDINE HCL 0.1 MG PO TABS
0.1000 mg | ORAL_TABLET | Freq: Four times a day (QID) | ORAL | Status: AC
  Administered 2023-05-08 – 2023-05-09 (×6): 0.1 mg via ORAL
  Filled 2023-05-08 (×8): qty 1

## 2023-05-08 MED ORDER — ONDANSETRON 8 MG PO TBDP
8.0000 mg | ORAL_TABLET | Freq: Once | ORAL | Status: AC
  Administered 2023-05-08: 8 mg via ORAL
  Filled 2023-05-08: qty 1

## 2023-05-08 MED ORDER — LORAZEPAM 1 MG PO TABS
1.0000 mg | ORAL_TABLET | Freq: Once | ORAL | Status: AC
Start: 2023-05-08 — End: 2023-05-08
  Administered 2023-05-08: 1 mg via ORAL
  Filled 2023-05-08: qty 1

## 2023-05-08 MED ORDER — CLONIDINE HCL 0.1 MG PO TABS
0.1000 mg | ORAL_TABLET | Freq: Every day | ORAL | Status: DC
  Filled 2023-05-08: qty 1

## 2023-05-08 MED ORDER — METHOCARBAMOL 500 MG PO TABS
500.0000 mg | ORAL_TABLET | Freq: Three times a day (TID) | ORAL | Status: DC | PRN
  Administered 2023-05-08 – 2023-05-09 (×3): 500 mg via ORAL
  Filled 2023-05-08 (×3): qty 1

## 2023-05-08 MED ORDER — MAGNESIUM HYDROXIDE 400 MG/5ML PO SUSP
30.0000 mL | Freq: Every day | ORAL | Status: DC | PRN
Start: 2023-05-08 — End: 2023-05-14
  Administered 2023-05-08: 30 mL via ORAL
  Filled 2023-05-08: qty 30

## 2023-05-08 MED ORDER — DIPHENHYDRAMINE HCL 25 MG PO CAPS: 50.0000 mg | ORAL_CAPSULE | Freq: Three times a day (TID) | ORAL | Status: DC | PRN

## 2023-05-08 NOTE — Group Note (Signed)
Recreation Therapy Group Note   Group Topic:Leisure Education  Group Date: 05/08/2023 Start Time: 1000 End Time: 1100 Facilitators: Rosina Lowenstein, LRT, CTRS Location:  Craft Room  Group Description: Leisure. Patients were given the option to choose from singing karaoke, coloring mandalas, using oil pastels, journaling, or playing with play-doh. LRT and pts discussed the meaning of leisure, the importance of participating in leisure during their free time/when they're outside of the hospital, as well as how our leisure interests can also serve as coping skills.   Goal Area(s) Addressed:  Patient will identify a current leisure interest.  Patient will learn the definition of "leisure". Patient will practice making a positive decision. Patient will have the opportunity to try a new leisure activity. Patient will communicate with peers and LRT.    Affect/Mood: N/A   Participation Level: Did not attend    Clinical Observations/Individualized Feedback: Curly did not attend group due to not being on the unit yet.   Plan: Continue to engage patient in RT group sessions 2-3x/week.   Rosina Lowenstein, LRT, CTRS 05/08/2023 11:56 AM

## 2023-05-08 NOTE — Progress Notes (Signed)
   05/08/23 1600  Clinical Opiate Withdrawal Scale (COWS)  Resting Pulse Rate 1  Sweating 0  Restlessness 0  Pupil Size 0  Bone or Joint Aches 0  Runny Nose or Tearing 2  GI Upset 1  Tremor 0  Yawning 0  Anxiety or Irritability 1  Gooseflesh Skin 0  COWS Total Score 5

## 2023-05-08 NOTE — Group Note (Signed)
Date:  05/08/2023 Time:  6:48 PM  Group Topic/Focus:  Activity Group:  The focus of the group is to promote activity for the patients to receive some fresh air and get some exercise out in the courtyard.    Participation Level:  Did Not Attend   [name] [name] 05/08/2023, 6:48 PM

## 2023-05-08 NOTE — Progress Notes (Signed)
Patient showed up to the ED, via  [facility], from [facility] without report being called. This RN called [facility] AC to get contact information to [facility], in which the contact for [facility] and [facility] were provided. [facility] transferred this RN to Riley Lam, RN who was taking care of patient at [facility]. When this RN got in touch with Riley Lam, it was stated that he tried calling BMU three times, with no answer, so he sent the patient on over. This RN asked Riley Lam, RN why he would send the patient over without giving report, and he stated that transport had been called and that they were slammed with work, so he sent the patient. Riley Lam also stated that the note told him to call report at the time of transport. This RN stated that this would be reported as a safety zone, in which [place], RN stated "well, you can put your safety zone in", and that he had only had this patient for an hour. Once report was given, Riley Lam, RN said good luck to this RN.

## 2023-05-08 NOTE — Progress Notes (Addendum)
Inpatient Behavioral Health Placement   Pt was recommended for [facility] ([facility]ility Based Crisis [facility]) placement per psych provider Alona Bene, PMHNP.   Per [facility] psych provider Chinwendu Royston Bake, NP  Informed that pt has been denied for placement at [facility] at this time and has reccommended inpatient behavioral health placement.  Per [facility] Antoinette Cillo, RN pt is under review within [facility] system PENDING IVC uploaded to pt's chart and a completed CCRS assessment prior to transfer.  [facility] Antoinette Cillo, RN will update care team with assignment information once PENDING items are completed.  Care Team notified:[facility] Antoinette [place], RN Latricia Delmar [place], Demetria Marrion Coy, MSW, [facility] 05/08/2023 12:15 AM

## 2023-05-08 NOTE — Tx Team (Signed)
Initial Treatment Plan 05/08/2023 6:51 PM GABREAL HOWICK VWU:[id number]    PATIENT STRESSORS: Medication change or noncompliance   Substance abuse     PATIENT STRENGTHS: Ability for insight  Communication skills  General fund of knowledge  Motivation for treatment/growth  Supportive family/friends    PATIENT IDENTIFIED PROBLEMS: SI by OD on Fentanyl  Depression  Anxiety                 DISCHARGE CRITERIA:  Ability to meet basic life and health needs Improved stabilization in mood, thinking, and/or behavior Need for constant or close observation no longer present Reduction of life-threatening or endangering symptoms to within safe limits  PRELIMINARY DISCHARGE PLAN: Outpatient therapy Return to previous living arrangement  PATIENT/FAMILY INVOLVEMENT: This treatment plan has been presented to and reviewed with the patient, MANDEEP KOSTAS. The patient has been given the opportunity to ask questions and make suggestions.  Natalyia Innes, RN 05/08/2023, 6:51 PM

## 2023-05-08 NOTE — Plan of Care (Signed)
New admission.  Problem: Education: Goal: Knowledge of [facility] Education information/materials will improve Outcome: Not Progressing Goal: Emotional status will improve Outcome: Not Progressing Goal: Mental status will improve Outcome: Not Progressing Goal: Verbalization of understanding the information provided will improve Outcome: Not Progressing   Problem: Activity: Goal: Interest or engagement in activities will improve Outcome: Not Progressing Goal: Sleeping patterns will improve Outcome: Not Progressing   Problem: Coping: Goal: Ability to verbalize frustrations and anger appropriately will improve Outcome: Not Progressing Goal: Ability to demonstrate self-control will improve Outcome: Not Progressing   Problem: Health Behavior/Discharge Planning: Goal: Identification of resources available to assist in meeting health care needs will improve Outcome: Not Progressing Goal: Compliance with treatment plan for underlying cause of condition will improve Outcome: Not Progressing   Problem: Physical Regulation: Goal: Ability to maintain clinical measurements within normal limits will improve Outcome: Not Progressing   Problem: Safety: Goal: Periods of time without injury will increase Outcome: Not Progressing   Problem: Physical Regulation: Goal: Complications related to the disease process, condition or treatment will be avoided or minimized Outcome: Not Progressing   Problem: Safety: Goal: Ability to remain free from injury will improve Outcome: Not Progressing   Problem: Coping: Goal: Coping ability will improve Outcome: Not Progressing   Problem: Self-Concept: Goal: Ability to disclose and discuss suicidal ideas will improve Outcome: Not Progressing

## 2023-05-08 NOTE — Progress Notes (Signed)
   05/08/23 1853  Charting Type  Charting Type Admission  Safety Check Verification  Has the RN verified the 15 minute safety check completion? Yes  Neurological  Neuro (WDL) WDL  HEENT  HEENT (WDL) WDL  Respiratory  Respiratory (WDL) WDL  Cardiac  Cardiac (WDL) WDL  Vascular  Vascular (WDL) WDL  Integumentary  Integumentary (WDL) X  Staff Member Assisting with Skin Assessment on Admission Stephanie, Security  Skin Color Appropriate for ethnicity  Skin Condition Dry  Skin Integrity Intact;Other (Comment) (injection sites on bilateral arms antecubital space with no signs of infection.)  Skin Turgor Non-tenting  Braden Scale (Ages 8 and up)  Sensory Perceptions 4  Moisture 4  Activity 3  Mobility 4  Nutrition 3  Friction and Shear 3  Braden Scale Score 21  Braden Interventions  Braden Scale Interventions No interventions  Musculoskeletal  Musculoskeletal (WDL) WDL  Gastrointestinal  Gastrointestinal (WDL) WDL  Last BM Date  05/08/23  GU Assessment  Genitourinary (WDL) WDL  Genitalia  Male Genitalia Intact  Neurological  Level of Consciousness Alert

## 2023-05-08 NOTE — ED Notes (Signed)
Pt has bed at [facility] room 316. Attending is Dr. Marlou Porch. Dx is polysubstance abuse. Room will be available at 0930 AM. Call report to [phone] at time of transport.

## 2023-05-08 NOTE — Progress Notes (Signed)
Pt was accepted to [facility]  05/08/2023 ; Bed Assignment 316 PENDING CIWA/COWS and IVC to be uploaded to pt's chart or faxed to [facility] FAX Number [phone] Address: [address] [place], [place], [place] [address]  DX: Polysubstance abuse  Pt meets inpatient criteria per Mancel Bale, NP   Attending Physician will be Dr. Shellee Milo  Report can be called to: [phone]  Pt can arrive after: 9:30am  Per [facility] AC, report to be called to [phone] AT time of transport. Please ensure that patient's original IVC is uploaded prior to initiating transfer. Provider, please include admission orders, agitation protocol and CIWA/COWS as indicated. Pt is pre-registered.  Care Team notified: [facility] Sharyne Peach, RN Wynona Luna, Demetria Marrion Coy, MSW, LCSWA 05/08/2023 1:00 AM

## 2023-05-08 NOTE — ED Provider Notes (Signed)
Emergency Medicine Observation Re-evaluation Note  [name] [name] is a 47 y.o. male, seen on rounds today.  Pt initially presented to the ED for complaints of Suicidal Currently, the patient is resting quietly in bed.  Physical Exam  BP 124/87   Pulse 73   Temp 98 F (36.7 C) (Oral)   Resp 20   Ht 5\' 11"  (1.803 m)   Wt 72.6 kg   SpO2 96%   BMI 22.32 kg/m  Physical Exam General: No acute distress Cardiac: Well-perfused Lungs: Nonlabored Psych: Calm  ED Course / MDM  EKG:EKG Interpretation Date/Time:  Thursday May 07 2023 16:13:12 EST Ventricular Rate:  74 PR Interval:  110 QRS Duration:  84 QT Interval:  376 QTC Calculation: 417 R Axis:   77  Text Interpretation: Sinus rhythm with short PR Otherwise normal ECG When compared with ECG of 27-Jun-2021 08:45, PR interval has increased T wave inversion no longer evident in Inferior leads Confirmed by Dione Booze ([id number]) on 05/07/2023 10:59:39 PM  I have reviewed the labs performed to date as well as medications administered while in observation.  Recent changes in the last 24 hours include evaluation by TTS.  Plan  Current plan is for transfer to [facility] for further evaluation.    Terrilee Files, MD 05/08/23 [phone]

## 2023-05-08 NOTE — ED Notes (Signed)
ED TO INPATIENT HANDOFF REPORT  ED Nurse Name and Phone #: Miquel Dunn Name/Age/Gender [name] [name] 47 y.o. male Room/Bed: APAH7/APAH7  Code Status   Code Status: Not on file  Home/SNF/Other Home Patient oriented to: self, place, time, and situation Is this baseline? Yes   Triage Complete: Triage complete  Chief Complaint Medical Clearance  Triage Note Pt stated that he was trying to kill himself with fentanyl  Having suicidal thoughts x2 years More frequently over the last few months   Pt stated that he took fentanyl yesterday in an attempt to kill himself. Stated that he took almost a gram  Pt slow to answer questions in triage     Allergies Allergies  Allergen Reactions   Tramadol Shortness Of Breath and Rash   Biaxin [Clarithromycin] Hives    Level of Care/Admitting Diagnosis ED Disposition     ED Disposition  Transfer via Transport   Condition  --   Comment  The patient appears reasonably stabilized for transfer considering the current resources, flow, and capabilities available in the ED at this time, and I doubt any other [facility] requiring further screening and/or treatment in the ED prior to transfer is p resent.          B Medical/Surgery History Past Medical History:  Diagnosis Date   Hep C w/o coma, chronic (HCC)    Renal disorder    Past Surgical History:  Procedure Laterality Date   HAND SURGERY     HAND SURGERY     INGUINAL HERNIA REPAIR Right 08/02/2021   Procedure: HERNIA REPAIR INGUINAL ADULT WITH MESH;  Surgeon: Lucretia Roers, MD;  Location: [facility]S;  Service: General;  Laterality: Right;     A IV Location/Drains/Wounds Patient Lines/Drains/Airways Status     Active Line/Drains/Airways     None            Intake/Output Last 24 hours No intake or output data in the 24 hours ending 05/08/23 0839  Labs/Imaging Results for orders placed or performed during the hospital encounter of 05/07/23 (from the past 48  hour(s))  Comprehensive metabolic panel     Status: Abnormal   Collection Time: 05/07/23  6:19 PM  Result Value Ref Range   Sodium 136 135 - 145 mmol/L   Potassium 3.8 3.5 - 5.1 mmol/L   Chloride 101 98 - 111 mmol/L   CO2 24 22 - 32 mmol/L   Glucose, Bld 143 (H) 70 - 99 mg/dL    Comment: Glucose reference range applies only to samples taken after fasting for at least 8 hours.   BUN 9 6 - 20 mg/dL   Creatinine, Ser 3.24 0.61 - 1.24 mg/dL   Calcium 9.3 8.9 - 40.1 mg/dL   Total Protein 8.2 (H) 6.5 - 8.1 g/dL   Albumin 3.5 3.5 - 5.0 g/dL   AST 16 15 - 41 U/L   ALT 15 0 - 44 U/L   Alkaline Phosphatase 96 38 - 126 U/L   Total Bilirubin 0.6 <1.2 mg/dL   GFR, Estimated >02 >72 mL/min    Comment: (NOTE) Calculated using the CKD-EPI Creatinine Equation (2021)    Anion gap 11 5 - 15    Comment: Performed at [facility], [address] [place], [place] [address]  Ethanol     Status: None   Collection Time: 05/07/23  6:19 PM  Result Value Ref Range   Alcohol, Ethyl (B) <10 <10 mg/dL    Comment: (NOTE) Lowest detectable  limit for serum alcohol is 10 mg/dL.  For medical purposes only. Performed at [facility], [address] [place], [place] [address]   cbc     Status: Abnormal   Collection Time: 05/07/23  6:19 PM  Result Value Ref Range   WBC 5.4 4.0 - 10.5 K/uL   RBC 4.23 4.22 - 5.81 MIL/uL   Hemoglobin 12.5 (L) 13.0 - 17.0 g/dL   HCT 60.4 (L) 54.0 - 98.1 %   MCV 86.1 80.0 - 100.0 fL   MCH 29.6 26.0 - 34.0 pg   MCHC 34.3 30.0 - 36.0 g/dL   RDW 19.1 47.8 - 29.5 %   Platelets 256 150 - 400 K/uL   nRBC 0.0 0.0 - 0.2 %    Comment: Performed at [facility], [address] [place], [place] [address]  Salicylate level     Status: Abnormal   Collection Time: 05/07/23  6:19 PM  Result Value Ref Range   Salicylate Lvl <7.0 (L) 7.0 - 30.0 mg/dL    Comment: Performed at [facility], [address] [place], [place] [address]   No results found.  Pending Labs Unresulted Labs (From  admission, onward)     Start     Ordered   05/07/23 1614  Rapid urine drug screen (hospital performed)  Once,   STAT        05/07/23 1613            Vitals/Pain Today's Vitals   05/07/23 1709 05/07/23 2040 05/08/23 0422 05/08/23 0828  BP:   124/87 (!) 127/91  Pulse:   73 90  Resp:   20 17  Temp:   98 F (36.7 C) 98.6 F (37 C)  TempSrc:   Oral Oral  SpO2:   96% 99%  Weight:      Height:      PainSc: 6  0-No pain  7     Isolation Precautions No active isolations  Medications Medications  OLANZapine zydis (ZYPREXA) disintegrating tablet 5 mg (has no administration in time range)    And  LORazepam (ATIVAN) tablet 1 mg (1 mg Oral Given 05/07/23 2037)    And  ziprasidone (GEODON) injection 20 mg (has no administration in time range)  nicotine (NICODERM CQ - dosed in mg/24 hours) patch 21 mg (21 mg Transdermal Patch Applied 05/07/23 2111)  ondansetron (ZOFRAN-ODT) disintegrating tablet 4 mg (4 mg Oral Given 05/07/23 2110)  ondansetron (ZOFRAN-ODT) disintegrating tablet 8 mg (8 mg Oral Given 05/08/23 0418)  LORazepam (ATIVAN) tablet 1 mg (1 mg Oral Given 05/08/23 0418)    Mobility walks       R Recommendations: See Admitting Provider Note  Report given to:

## 2023-05-08 NOTE — Group Note (Signed)
Date:  05/08/2023 Time:  11:08 PM  Group Topic/Focus:  Wrap-Up Group:   The focus of this group is to help patients review their daily goal of treatment and discuss progress on daily workbooks.    Participation Level:  Active  Participation Quality:  Appropriate  Affect:  Appropriate  Cognitive:  Appropriate  Insight: Appropriate  Engagement in Group:  Engaged  Modes of Intervention:  Discussion   Lenore Cordia 05/08/2023, 11:08 PM

## 2023-05-08 NOTE — Progress Notes (Signed)
   05/08/23 1800  Psych Admission Type (Psych Patients Only)  Admission Status Involuntary  Psychosocial Assessment  Patient Complaints Anxiety;Depression  Eye Contact Fair  Facial Expression Pained  Affect Anxious;Preoccupied  Speech Logical/coherent  Interaction Assertive  Motor Activity Slow  Appearance/Hygiene In scrubs;Poor hygiene;Disheveled  Behavior Characteristics Cooperative;Appropriate to situation;Restless  Mood Anxious;Preoccupied;Pleasant  Aggressive Behavior  Effect No apparent injury  Thought Process  Coherency Circumstantial  Content Preoccupation  Delusions None reported or observed  Perception WDL  Hallucination None reported or observed  Judgment WDL  Confusion None  Danger to Self  Current suicidal ideation? Denies  Danger to Others  Danger to Others None reported or observed

## 2023-05-08 NOTE — ED Notes (Addendum)
Attempted to call report three times at specified number [phone]) without any answer.

## 2023-05-09 DIAGNOSIS — F332 Major depressive disorder, recurrent severe without psychotic features: Secondary | ICD-10-CM | POA: Diagnosis not present

## 2023-05-09 MED ORDER — TRAZODONE HCL 100 MG PO TABS
100.0000 mg | ORAL_TABLET | Freq: Every day | ORAL | Status: DC
  Administered 2023-05-09 – 2023-05-12 (×4): 100 mg via ORAL
  Filled 2023-05-09 (×4): qty 1

## 2023-05-09 MED ORDER — NAPROXEN 500 MG PO TABS
500.0000 mg | ORAL_TABLET | Freq: Two times a day (BID) | ORAL | Status: DC
  Administered 2023-05-09 – 2023-05-12 (×7): 500 mg via ORAL
  Filled 2023-05-09 (×8): qty 1

## 2023-05-09 MED ORDER — METHOCARBAMOL 500 MG PO TABS
500.0000 mg | ORAL_TABLET | Freq: Three times a day (TID) | ORAL | Status: DC
  Administered 2023-05-09 – 2023-05-12 (×3): 500 mg via ORAL
  Filled 2023-05-09 (×5): qty 1

## 2023-05-09 MED ORDER — GABAPENTIN 300 MG PO CAPS
300.0000 mg | ORAL_CAPSULE | Freq: Four times a day (QID) | ORAL | Status: DC
  Administered 2023-05-09 – 2023-05-12 (×15): 300 mg via ORAL
  Filled 2023-05-09 (×16): qty 1

## 2023-05-09 MED ORDER — BUPROPION HCL 75 MG PO TABS
75.0000 mg | ORAL_TABLET | Freq: Every day | ORAL | Status: DC
  Administered 2023-05-09 – 2023-05-11 (×3): 75 mg via ORAL
  Filled 2023-05-09 (×6): qty 1

## 2023-05-09 NOTE — Group Note (Signed)
Date:  05/09/2023 Time:  4:06 PM  Group Topic/Focus:  Wellness Toolbox:   The focus of this group is to discuss various aspects of wellness, balancing those aspects and exploring ways to increase the ability to experience wellness.  Patients will create [name] wellness toolbox for use upon discharge. Outdoor Recreation Activity    Participation Level:  Minimal  Participation Quality:  Appropriate  Affect:  Appropriate  Cognitive:  Appropriate  Insight: Appropriate  Engagement in Group:  Poor  Modes of Intervention:  Activity  Additional Comments:    [name] [name] [name] [name] 05/09/2023, 4:06 PM
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
## 2023-05-09 NOTE — [facility] Suicide Risk Assessment (Signed)
[facility] INPATIENT:  Family/Significant Other Suicide Prevention Education  Suicide Prevention Education:  Education Completed; [name] [name] (sister) [phone],  has been identified by the patient as the family member/significant other with whom the patient will be residing, and identified as the person(s) who will aid the patient in the event of a mental health crisis (suicidal ideations/suicide attempt).  With written consent from the patient, the family member/significant other has been provided the following suicide prevention education, prior to the and/or following the discharge of the patient.  The suicide prevention education provided includes the following: Suicide risk factors Suicide prevention and interventions National Suicide Hotline telephone number [facility] assessment telephone number [facility] Emergency Assistance [address] [facility] [address] and/or Residential Mobile Crisis Unit telephone number  Request made of family/significant other to: Remove weapons (e.g., guns, rifles, knives), all items previously/currently identified as safety concern.   Remove drugs/medications (over-the-counter, prescriptions, illicit drugs), all items previously/currently identified as a safety concern.  The family member/significant other verbalizes understanding of the suicide prevention education information provided.  The family member/significant other agrees to remove the items of safety concern listed above.  I spoke with the patients sister regarding access to weapons, how to support their family member, and safety concerns. The family member advised Pt. Was Dx'd w/ Bipolar in his 20's and Hep C in recent years. The family member is supportive of her brother receiving help for his substance use issues and mental health.    [name] [name] 05/09/2023, 3:45 PM

## 2023-05-09 NOTE — Group Note (Signed)
[facility] LCSW Group Therapy Note   Group Date: 05/09/2023 Start Time: 1315 End Time: 1350   Type of Therapy/Topic:  Group Therapy:  Balance in Life  Participation Level:  Did Not Attend   Description of Group:    This group will address the concept of balance and how it feels and looks when one is unbalanced. Patients will be encouraged to process areas in their lives that are out of balance, and identify reasons for remaining unbalanced. Facilitators will guide patients utilizing problem- solving interventions to address and correct the stressor making their life unbalanced. Understanding and applying boundaries will be explored and addressed for obtaining  and maintaining a balanced life. Patients will be encouraged to explore ways to assertively make their unbalanced needs known to significant others in their lives, using other group members and facilitator for support and feedback.  Therapeutic Goals: Patient will identify two or more emotions or situations they have that consume much of in their lives. Patient will identify signs/triggers that life has become out of balance:  Patient will identify two ways to set boundaries in order to achieve balance in their lives:  Patient will demonstrate ability to communicate their needs through discussion and/or role plays  Summary of Patient Progress:   The patient did not attend group.        Marshell Levan, LCSW

## 2023-05-09 NOTE — Plan of Care (Signed)
  Problem: Education: Goal: Knowledge of [facility] Education information/materials will improve 05/09/2023 1212 by Delos Haring, RN Outcome: Not Progressing 05/09/2023 1212 by Delos Haring, RN Outcome: Not Progressing Goal: Emotional status will improve 05/09/2023 1212 by Delos Haring, RN Outcome: Not Progressing 05/09/2023 1212 by Delos Haring, RN Outcome: Not Progressing Goal: Mental status will improve 05/09/2023 1212 by Delos Haring, RN Outcome: Not Progressing 05/09/2023 1212 by Delos Haring, RN Outcome: Not Progressing Goal: Verbalization of understanding the information provided will improve 05/09/2023 1212 by Delos Haring, RN Outcome: Not Progressing 05/09/2023 1212 by Delos Haring, RN Outcome: Not Progressing   Problem: Activity: Goal: Interest or engagement in activities will improve 05/09/2023 1212 by Delos Haring, RN Outcome: Not Progressing 05/09/2023 1212 by Delos Haring, RN Outcome: Not Progressing Goal: Sleeping patterns will improve 05/09/2023 1212 by Delos Haring, RN Outcome: Not Progressing 05/09/2023 1212 by Delos Haring, RN Outcome: Not Progressing   Problem: Coping: Goal: Ability to verbalize frustrations and anger appropriately will improve 05/09/2023 1212 by Delos Haring, RN Outcome: Not Progressing 05/09/2023 1212 by Delos Haring, RN Outcome: Not Progressing Goal: Ability to demonstrate self-control will improve 05/09/2023 1212 by Delos Haring, RN Outcome: Not Progressing 05/09/2023 1212 by Delos Haring, RN Outcome: Not Progressing   Problem: Health Behavior/Discharge Planning: Goal: Identification of resources available to assist in meeting health care needs will improve 05/09/2023 1212 by Delos Haring, RN Outcome: Not Progressing 05/09/2023 1212 by Delos Haring, RN Outcome: Not Progressing Goal: Compliance with treatment plan for underlying cause of condition will  improve 05/09/2023 1212 by Delos Haring, RN Outcome: Not Progressing 05/09/2023 1212 by Delos Haring, RN Outcome: Not Progressing   Problem: Physical Regulation: Goal: Ability to maintain clinical measurements within normal limits will improve 05/09/2023 1212 by Delos Haring, RN Outcome: Not Progressing 05/09/2023 1212 by Delos Haring, RN Outcome: Not Progressing   Problem: Safety: Goal: Periods of time without injury will increase 05/09/2023 1212 by Delos Haring, RN Outcome: Not Progressing 05/09/2023 1212 by Delos Haring, RN Outcome: Not Progressing   Problem: Physical Regulation: Goal: Complications related to the disease process, condition or treatment will be avoided or minimized 05/09/2023 1212 by Delos Haring, RN Outcome: Not Progressing 05/09/2023 1212 by Delos Haring, RN Outcome: Not Progressing   Problem: Safety: Goal: Ability to remain free from injury will improve 05/09/2023 1212 by Delos Haring, RN Outcome: Not Progressing 05/09/2023 1212 by Delos Haring, RN Outcome: Not Progressing   Problem: Coping: Goal: Coping ability will improve 05/09/2023 1212 by Delos Haring, RN Outcome: Not Progressing 05/09/2023 1212 by Delos Haring, RN Outcome: Not Progressing   Problem: Self-Concept: Goal: Ability to disclose and discuss suicidal ideas will improve 05/09/2023 1212 by Delos Haring, RN Outcome: Not Progressing 05/09/2023 1212 by Delos Haring, RN Outcome: Not Progressing Goal: Will verbalize positive feelings about self 05/09/2023 1212 by Delos Haring, RN Outcome: Not Progressing Note:   05/09/2023 1212 by Delos Haring, RN Outcome: Not Progressing Note:

## 2023-05-09 NOTE — BHH Suicide Risk Assessment (Signed)
[facility] Admission Suicide Risk Assessment   Nursing information obtained from:  Patient Demographic factors:  Male, Caucasian, Low socioeconomic status Current Mental Status:  NA Loss Factors:  NA Historical Factors:  NA Risk Reduction Factors:  Living with another person, especially a relative  Total Time spent with patient: 1 hour Principal Problem: Major depressive disorder, recurrent severe without psychotic features (HCC) Diagnosis:  Principal Problem:   Major depressive disorder, recurrent severe without psychotic features (HCC) Active Problems:   Polysubstance abuse (HCC)  Subjective Data: overdosed intentionally on Fentanyl  Continued Clinical Symptoms:  Alcohol Use Disorder Identification Test Final Score (AUDIT): 0 The "Alcohol Use Disorders Identification Test", Guidelines for Use in Primary Care, Second Edition.  World Science writer [facility]). Score between 0-7:  no or low risk or alcohol related problems. Score between 8-15:  moderate risk of alcohol related problems. Score between 16-19:  high risk of alcohol related problems. Score 20 or above:  warrants further diagnostic evaluation for alcohol dependence and treatment.   CLINICAL FACTORS:   Depression:   Insomnia Severe   Musculoskeletal: Strength & Muscle Tone: within normal limits Gait & Station: normal Patient leans: N/A  Psychiatric Specialty Exam: Physical Exam Vitals and nursing note reviewed.  Constitutional:      Appearance: Normal appearance.  HENT:     Head: Normocephalic.     Nose: Nose normal.  Pulmonary:     Effort: Pulmonary effort is normal.  Musculoskeletal:        General: Normal range of motion.     Cervical back: Normal range of motion.  Neurological:     General: No focal deficit present.     Mental Status: He is alert and oriented to person, place, and time.     Review of Systems  Psychiatric/Behavioral:  Positive for depression and substance abuse. The patient is  nervous/anxious.   All other systems reviewed and are negative.   Blood pressure 117/81, pulse 80, temperature 98.4 F (36.9 C), temperature source Oral, resp. rate 18, height 5\' 11"  (1.803 m), weight 71.2 kg, SpO2 99%.Body mass index is 21.9 kg/m.  General Appearance: Casual  Eye Contact:  Fair  Speech:  Normal Rate  Volume:  Normal  Mood:  Anxious and Depressed  Affect:  Congruent  Thought Process:  Coherent and Descriptions of Associations: Intact  Orientation:  Full (Time, Place, and Person)  Thought Content:  WDL and Logical  Suicidal Thoughts:  No  Homicidal Thoughts:  No  Memory:  Immediate;   Fair Recent;   Fair Remote;   Fair  Judgement:  Fair  Insight:  Fair  Psychomotor Activity:  Decreased  Concentration:  Concentration: Fair and Attention Span: Fair  Recall:  [employer] of Knowledge:  Fair  Language:  Good  Akathisia:  No  Handed:  Right  AIMS (if indicated):     Assets:  Leisure Time Physical Health Resilience  ADL's:  Intact  Cognition:  WNL  Sleep:         Physical Exam: Physical Exam Vitals and nursing note reviewed.  Constitutional:      Appearance: Normal appearance.  HENT:     Head: Normocephalic.     Nose: Nose normal.  Pulmonary:     Effort: Pulmonary effort is normal.  Musculoskeletal:        General: Normal range of motion.     Cervical back: Normal range of motion.  Neurological:     General: No focal deficit present.  Mental Status: He is alert and oriented to person, place, and time.    Review of Systems  Psychiatric/Behavioral:  Positive for depression and substance abuse. The patient is nervous/anxious.   All other systems reviewed and are negative.  Blood pressure 117/81, pulse 80, temperature 98.4 F (36.9 C), temperature source Oral, resp. rate 18, height 5\' 11"  (1.803 m), weight 71.2 kg, SpO2 99%. Body mass index is 21.9 kg/m.   COGNITIVE FEATURES THAT CONTRIBUTE TO RISK:  None    SUICIDE RISK:   Minimal: No  identifiable suicidal ideation.  Patients presenting with no risk factors but with morbid ruminations; may be classified as minimal risk based on the severity of the depressive symptoms  PLAN OF CARE:  Major depressive disorder, recurrent, severe without psychosis:  Wellbutrin 75 mg daily started  Opiate dependency/withdrawal: Clonidine opiate protocol Gabapentin 300 mg TID increased to QID  Anxiety: Hydroxyzine 25 mg every six hours PRN  Insomnia: Trazodone 100 mg daily at bedtime  I certify that inpatient services furnished can reasonably be expected to improve the patient's condition.   Nanine Means, NP 05/09/2023, 9:14 AM

## 2023-05-09 NOTE — Group Note (Signed)
Date:  05/09/2023 Time:  11:50 AM  Group Topic/Focus:  Goals Group:   The focus of this group is to help patients establish daily goals to achieve during treatment and discuss how the patient can incorporate goal setting into their daily lives to aide in recovery. Making Healthy Choices:   The focus of this group is to help patients identify negative/unhealthy choices they were using prior to admission and identify positive/healthier coping strategies to replace them upon discharge.    Participation Level:  Did Not Attend   Lynelle Smoke [facility] 05/09/2023, 11:50 AM
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
## 2023-05-09 NOTE — Plan of Care (Signed)
Problem: Education: Goal: Knowledge of [facility] Education information/materials will improve Outcome: Progressing

## 2023-05-09 NOTE — Progress Notes (Signed)
D- Patient alert and oriented x 4. Affect flat/mood congruent and irritable. Denies SI/ HI/ AVH. Patient denies pain. He complains of withdraw symptoms and just "feels bad". Robaxin and bentyl administered with fair results. Patient endorses depression and anxiety. He inquired about when he could leave the hospital and be discharged. Involuntary commitment (IVC) explained and he verbalizes understanding. A- Scheduled medications administered to patient, per MD orders. Support and encouragement provided.  Routine safety checks conducted every 15 minutes without incident.  Patient informed to notify staff with problems or concerns and verbalizes understanding. R- No adverse drug reactions noted.  Patient compliant with medications and treatment plan. Patient receptive, calm cooperative and isolates to his room except for meals. Patient contracts for safety and  remains safe on the unit at this time.

## 2023-05-10 DIAGNOSIS — F332 Major depressive disorder, recurrent severe without psychotic features: Secondary | ICD-10-CM | POA: Diagnosis not present

## 2023-05-10 MED ORDER — QUETIAPINE FUMARATE 25 MG PO TABS
50.0000 mg | ORAL_TABLET | Freq: Two times a day (BID) | ORAL | Status: DC | PRN
  Administered 2023-05-10 – 2023-05-11 (×4): 50 mg via ORAL
  Filled 2023-05-10 (×4): qty 2

## 2023-05-10 MED ORDER — TRAZODONE HCL 50 MG PO TABS: 25.0000 mg | ORAL_TABLET | Freq: Two times a day (BID) | ORAL | Status: DC

## 2023-05-10 NOTE — Plan of Care (Signed)
  Problem: Education: Goal: Mental status will improve Outcome: Progressing   Problem: Education: Goal: Verbalization of understanding the information provided will improve Outcome: Progressing   Problem: Health Behavior/Discharge Planning: Goal: Compliance with treatment plan for underlying cause of condition will improve Outcome: Progressing

## 2023-05-10 NOTE — Group Note (Signed)
Date:  05/10/2023 Time:  1:22 PM  Group Topic/Focus:  Goals Group:   The focus of this group is to help patients establish daily goals to achieve during treatment and discuss how the patient can incorporate goal setting into their daily lives to aide in recovery. Group community meeting    Participation Level:  Did Not Attend   [name] [name] 05/10/2023, 1:22 PM

## 2023-05-10 NOTE — Group Note (Unsigned)
Date:  05/10/2023 Time:  2:26 AM  Group Topic/Focus:  Wrap-Up Group:   The focus of this group is to help patients review their daily goal of treatment and discuss progress on daily workbooks.     Participation Level:  {BHH PARTICIPATION ZOXWR:60454}  Participation Quality:  {BHH PARTICIPATION QUALITY:22265}  Affect:  {BHH AFFECT:22266}  Cognitive:  {BHH COGNITIVE:22267}  Insight: {BHH Insight2:20797}  Engagement in Group:  {BHH ENGAGEMENT IN UJWJX:91478}  Modes of Intervention:  {BHH MODES OF INTERVENTION:22269}  Additional Comments:  ***  Lenore Cordia 05/10/2023, 2:26 AM

## 2023-05-10 NOTE — Progress Notes (Signed)
   05/09/23 2100  Psych Admission Type (Psych Patients Only)  Admission Status Involuntary  Psychosocial Assessment  Patient Complaints Depression;Hopelessness;Irritability  Eye Contact Fair  Facial Expression Flat  Affect Blunted;Preoccupied  Speech Logical/coherent  Interaction Assertive;Demanding;Defensive  Motor Activity Restless  Appearance/Hygiene Poor hygiene  Behavior Characteristics Cooperative;Appropriate to situation;Calm  Mood Pleasant;Preoccupied  Thought Process  Coherency Circumstantial  Content Preoccupation  Delusions None reported or observed  Perception WDL  Hallucination None reported or observed  Judgment WDL  Confusion None  Danger to Self  Current suicidal ideation? Denies  Agreement Not to Harm Self Yes   Patient is alert and oriented x 4, affect bis blunted thoughts are organized. 15 minutes safety checks maintained will continue to monitor.

## 2023-05-10 NOTE — Progress Notes (Signed)
[facility] MD Progress Note  05/10/2023 9:20 AM [name] [name]  MRN:  [id number]  Subjective:  Notes, vital signs, and labs reviewed. Cora didn't sleep much last night but did eat breakfast this morning. "I feel weak and having a hard time thinking clearly." He denies depression, reports mild anxiety. Jeremiah denies suicidal or homicidal ideation. Denies hallucinations and delusions. I'm concerned the clonidine is making me feel worse and curious if he can switch to Ativan which is not appropriate as he is not detoxing from alcohol.    Principal Problem: Major depressive disorder, recurrent severe without psychotic features (HCC) Diagnosis: Principal Problem:   Major depressive disorder, recurrent severe without psychotic features (HCC) Active Problems:   Polysubstance abuse (HCC)  Total Time spent with patient: 30 minutes  Past Psychiatric History: depression, anxiety, opiate use d/o  Past Medical History:  Past Medical History:  Diagnosis Date   Hep C w/o coma, chronic (HCC)    Renal disorder     Past Surgical History:  Procedure Laterality Date   HAND SURGERY     HAND SURGERY     INGUINAL HERNIA REPAIR Right 08/02/2021   Procedure: HERNIA REPAIR INGUINAL ADULT WITH MESH;  Surgeon: Lucretia Roers, MD;  Location: [facility];  Service: General;  Laterality: Right;   Family History:  Family History  Problem Relation Age of Onset   Diabetes Mother    Diabetes Father    Family Psychiatric  History: none Social History:  Social History   Substance and Sexual Activity  Alcohol Use No   Comment: rarely     Social History   Substance and Sexual Activity  Drug Use No    Social History   Socioeconomic History   Marital status: Legally Separated    Spouse name: Not on file   Number of children: Not on file   Years of education: Not on file   Highest education level: Not on file  Occupational History   Not on file  Tobacco Use   Smoking status: Every Day    Current  packs/day: 1.00    Types: Cigarettes   Smokeless tobacco: Never  Vaping Use   Vaping status: Never Used  Substance and Sexual Activity   Alcohol use: No    Comment: rarely   Drug use: No   Sexual activity: Never  Other Topics Concern   Not on file  Social History Narrative   Not on file   Social Determinants of Health   Financial Resource Strain: Not on file  Food Insecurity: No Food Insecurity (05/08/2023)   Hunger Vital Sign    Worried About Running Out of Food in the Last Year: Never true    Ran Out of Food in the Last Year: Never true  Transportation Needs: No Transportation Needs (05/08/2023)   PRAPARE - Administrator, Civil Service (Medical): No    Lack of Transportation (Non-Medical): No  Physical Activity: Not on file  Stress: Not on file  Social Connections: Not on file   Additional Social History: homeless, interested in rehab     Sleep: Poor  Appetite:  Fair  Current Medications: Current Facility-Administered Medications  Medication Dose Route Frequency Provider Last Rate Last Admin   acetaminophen (TYLENOL) tablet 650 mg  650 mg Oral Q6H PRN Onuoha, Chinwendu V, NP       alum & mag hydroxide-simeth (MAALOX/MYLANTA) 200-200-20 MG/5ML suspension 30 mL  30 mL Oral Q4H PRN Onuoha, Chinwendu V, NP  buPROPion [facility]) tablet 75 mg  75 mg Oral Daily Charm Rings, NP   75 mg at 05/10/23 6045   cloNIDine (CATAPRES) tablet 0.1 mg  0.1 mg Oral QID Onuoha, Chinwendu V, NP   0.1 mg at 05/09/23 2117   Followed by   cloNIDine (CATAPRES) tablet 0.1 mg  0.1 mg Oral BH-qamhs Onuoha, Chinwendu V, NP       Followed by   Melene Muller ON 05/13/2023] cloNIDine (CATAPRES) tablet 0.1 mg  0.1 mg Oral QAC breakfast Onuoha, Chinwendu V, NP       dicyclomine (BENTYL) tablet 20 mg  20 mg Oral Q6H PRN Onuoha, Chinwendu V, NP   20 mg at 05/09/23 1305   haloperidol (HALDOL) tablet 5 mg  5 mg Oral TID PRN Onuoha, Chinwendu V, NP       And   diphenhydrAMINE (BENADRYL)  capsule 50 mg  50 mg Oral TID PRN Onuoha, Chinwendu V, NP       haloperidol lactate (HALDOL) injection 5 mg  5 mg Intramuscular TID PRN Onuoha, Chinwendu V, NP       And   diphenhydrAMINE (BENADRYL) injection 50 mg  50 mg Intramuscular TID PRN Onuoha, Chinwendu V, NP       And   LORazepam (ATIVAN) injection 2 mg  2 mg Intramuscular TID PRN Onuoha, Chinwendu V, NP       gabapentin (NEURONTIN) capsule 300 mg  300 mg Oral QID Charm Rings, NP   300 mg at 05/10/23 4098   hydrOXYzine (ATARAX) tablet 25 mg  25 mg Oral Q6H PRN Onuoha, Chinwendu V, NP   25 mg at 05/09/23 1707   loperamide (IMODIUM) capsule 2-4 mg  2-4 mg Oral PRN Onuoha, Chinwendu V, NP       magnesium hydroxide (MILK OF MAGNESIA) suspension 30 mL  30 mL Oral Daily PRN Onuoha, Chinwendu V, NP   30 mL at 05/08/23 1322   methocarbamol (ROBAXIN) tablet 500 mg  500 mg Oral TID Charm Rings, NP   500 mg at 05/09/23 1707   naproxen (NAPROSYN) tablet 500 mg  500 mg Oral BID WC Charm Rings, NP   500 mg at 05/10/23 1191   nicotine polacrilex (NICORETTE) gum 2 mg  2 mg Oral PRN Sarina Ill, DO   2 mg at 05/10/23 0813   ondansetron (ZOFRAN-ODT) disintegrating tablet 4 mg  4 mg Oral Q6H PRN Onuoha, Chinwendu V, NP   4 mg at 05/08/23 1316   traZODone (DESYREL) tablet 100 mg  100 mg Oral QHS Charm Rings, NP   100 mg at 05/09/23 2111    Lab Results: No results found for this or any previous visit (from the past 48 hour(s)).  Blood Alcohol level:  Lab Results  Component Value Date   ETH <10 05/07/2023   ETH <5 10/24/2015    Metabolic Disorder Labs: No results found for: "HGBA1C", "MPG" No results found for: "PROLACTIN" No results found for: "CHOL", "TRIG", "HDL", "CHOLHDL", "VLDL", "LDLCALC"  Physical Findings: AIMS:  , ,  ,  ,    CIWA:    COWS:  COWS Total Score: 0  Musculoskeletal: Strength & Muscle Tone: within normal limits Gait & Station: normal Patient leans: N/A  Psychiatric Specialty Exam: Physical  Exam Vitals and nursing note reviewed.  Constitutional:      Appearance: Normal appearance.  HENT:     Head: Normocephalic.     Nose: Nose normal.  Pulmonary:     Effort:  Pulmonary effort is normal.  Musculoskeletal:        General: Normal range of motion.     Cervical back: Normal range of motion.  Neurological:     General: No focal deficit present.     Mental Status: He is alert and oriented to person, place, and time.     Review of Systems  Psychiatric/Behavioral:  Positive for substance abuse. The patient is nervous/anxious.   All other systems reviewed and are negative.   Blood pressure 101/80, pulse 81, temperature 97.8 F (36.6 C), resp. rate 18, height 5\' 11"  (1.803 m), weight 71.2 kg, SpO2 100%.Body mass index is 21.9 kg/m.  General Appearance: Casual  Eye Contact:  Fair  Speech:  Normal Rate  Volume:  Normal  Mood:  Anxious  Affect:  Congruent  Thought Process:  Coherent  Orientation:  Full (Time, Place, and Person)  Thought Content:  WDL and Logical  Suicidal Thoughts:  No  Homicidal Thoughts:  No  Memory:  Immediate;   Fair Recent;   Fair Remote;   Fair  Judgement:  Fair  Insight:  Fair  Psychomotor Activity:  Normal  Concentration:  Concentration: Fair and Attention Span: Fair  Recall:  Good  Fund of Knowledge:  Good  Language:  Good  Akathisia:  No  Handed:  Right  AIMS (if indicated):     Assets:  Leisure Time Physical Health Resilience  ADL's:  Intact  Cognition:  WNL  Sleep:   Poor      Physical Exam: Physical Exam Vitals and nursing note reviewed.  Constitutional:      Appearance: Normal appearance.  HENT:     Head: Normocephalic.     Nose: Nose normal.  Pulmonary:     Effort: Pulmonary effort is normal.  Musculoskeletal:        General: Normal range of motion.     Cervical back: Normal range of motion.  Neurological:     General: No focal deficit present.     Mental Status: He is alert and oriented to person, place, and time.     Review of Systems  Psychiatric/Behavioral:  Positive for substance abuse. The patient is nervous/anxious.   All other systems reviewed and are negative.  Blood pressure 101/80, pulse 81, temperature 97.8 F (36.6 C), resp. rate 18, height 5\' 11"  (1.803 m), weight 71.2 kg, SpO2 100%. Body mass index is 21.9 kg/m.   Treatment Plan Summary: Daily contact with patient to assess and evaluate symptoms and progress in treatment, Medication management, and Plan : Major depressive disorder, recurrent, severe without psychosis:  Wellbutrin 75 mg daily    Opiate dependency/withdrawal: Clonidine opiate protocol Gabapentin 300 mg QID   Anxiety: Hydroxyzine 25 mg every six hours PRN   Insomnia: Trazodone 100 mg daily at bedtime  Nanine Means, NP 05/10/2023, 9:20 AM

## 2023-05-10 NOTE — Group Note (Signed)
Date:  05/10/2023 Time:  9:39 PM  Group Topic/Focus:  Building Self Esteem:   The Focus of this group is helping patients become aware of the effects of self-esteem on their lives, the things they and others do that enhance or undermine their self-esteem, seeing the relationship between their level of self-esteem and the choices they make and learning ways to enhance self-esteem.    Participation Level:  Did Not Attend  Participation Quality:   none  Affect:   none  Cognitive:   none  Insight: None  Engagement in Group:   none  Modes of Intervention:   none  Additional Comments:  none   Gaylyn Berish 05/10/2023, 9:39 PM

## 2023-05-10 NOTE — Group Note (Signed)
Date:  05/10/2023 Time:  6:09 PM  Group Topic/Focus:  OUTDOOR RECREATION STRUCTURED ACTIVITY    Participation Level:  Did Not Attend   Niki Cosman 05/10/2023, 6:09 PM

## 2023-05-10 NOTE — Plan of Care (Signed)
Patient verbalized " stomach upset and nausea." PRN medications given with fair result. Patient did not eat lunch and dinner. Encouraged fluids. Seroquel started for withdrawals. Patient denies SI,HI and AVH. Visible in the milieu. Minimal interactions with peers. Support and encouragement given.

## 2023-05-11 LAB — HEMOGLOBIN A1C
Hgb A1c MFr Bld: 5.2 % (ref 4.8–5.6)
Mean Plasma Glucose: 102.54 mg/dL

## 2023-05-11 LAB — LIPID PANEL
Cholesterol: 134 mg/dL (ref 0–200)
HDL: 29 mg/dL — ABNORMAL LOW (ref >40)
LDL Cholesterol: 89 mg/dL (ref 0–99)
Total CHOL/HDL Ratio: 4.6 {ratio}
Triglycerides: 82 mg/dL (ref <150)
VLDL: 16 mg/dL (ref 0–40)

## 2023-05-11 NOTE — Progress Notes (Signed)
   05/10/23 2000  Psych Admission Type (Psych Patients Only)  Admission Status Involuntary  Psychosocial Assessment  Patient Complaints Restlessness  Eye Contact Fair  Facial Expression Flat  Affect Blunted;Preoccupied  Speech Logical/coherent  Interaction Assertive;Demanding;Defensive  Motor Activity Restless  Appearance/Hygiene Poor hygiene  Behavior Characteristics Cooperative;Appropriate to situation  Mood Pleasant  Thought Process  Coherency Circumstantial  Content Preoccupation  Delusions None reported or observed  Perception WDL  Hallucination None reported or observed  Judgment WDL  Confusion None  Danger to Self  Current suicidal ideation? Denies  Agreement Not to Harm Self Yes   Patient alert and oriented x 4, affect is flat but brightens upon approach denies SI/HI/AVH.

## 2023-05-11 NOTE — Progress Notes (Incomplete)
[facility] MD Progress Note  11/25/204  5:12 PM [name] [name]  MRN:  [id number] Subjective:  47 year old Caucasian male reports, "I feel sick to my stomach; I just want to rest."Patient reports no side effects or issues with current medications.Denies any thoughts of self-harm and agrees not to harm himself.Interaction: Assertive, demanding, defensive,Preoccupation and circumstantial thought processes.Poor sleep reported despite trazodone use.  Principal Problem: Major depressive disorder, recurrent severe without psychotic features (HCC) Diagnosis: Principal Problem:   Major depressive disorder, recurrent severe without psychotic features (HCC) Active Problems:   Polysubstance abuse (HCC)  Total Time spent with patient: 1 hour  Past Psychiatric History: Anxiety PolySubstance Abuse  Past Medical History:  Past Medical History:  Diagnosis Date   Hep C w/o coma, chronic (HCC)    Renal disorder     Past Surgical History:  Procedure Laterality Date   HAND SURGERY     HAND SURGERY     INGUINAL HERNIA REPAIR Right 08/02/2021   Procedure: HERNIA REPAIR INGUINAL ADULT WITH MESH;  Surgeon: Lucretia Roers, MD;  Location: [facility];  Service: General;  Laterality: Right;   Family History:  Family History  Problem Relation Age of Onset   Diabetes Mother    Diabetes Father    Family Psychiatric  History: none reported Social History:  Social History   Substance and Sexual Activity  Alcohol Use No   Comment: rarely     Social History   Substance and Sexual Activity  Drug Use No    Social History   Socioeconomic History   Marital status: Legally Separated    Spouse name: Not on file   Number of children: Not on file   Years of education: Not on file   Highest education level: Not on file  Occupational History   Not on file  Tobacco Use   Smoking status: Every Day    Current packs/day: 1.00    Types: Cigarettes   Smokeless tobacco: Never  Vaping Use   Vaping status: Never  Used  Substance and Sexual Activity   Alcohol use: No    Comment: rarely   Drug use: No   Sexual activity: Never  Other Topics Concern   Not on file  Social History Narrative   Not on file   Social Determinants of Health   Financial Resource Strain: Not on file  Food Insecurity: No Food Insecurity (05/08/2023)   Hunger Vital Sign    Worried About Running Out of Food in the Last Year: Never true    Ran Out of Food in the Last Year: Never true  Transportation Needs: No Transportation Needs (05/08/2023)   PRAPARE - Administrator, Civil Service (Medical): No    Lack of Transportation (Non-Medical): No  Physical Activity: Not on file  Stress: Not on file  Social Connections: Not on file   Additional Social History:                         Sleep: Fair  Appetite:  Good  Current Medications: Current Facility-Administered Medications  Medication Dose Route Frequency Provider Last Rate Last Admin   acetaminophen (TYLENOL) tablet 650 mg  650 mg Oral Q6H PRN Onuoha, Chinwendu V, NP       alum & mag hydroxide-simeth (MAALOX/MYLANTA) 200-200-20 MG/5ML suspension 30 mL  30 mL Oral Q4H PRN Onuoha, Chinwendu V, NP   30 mL at 05/12/23 0822   buPROPion (WELLBUTRIN) tablet 75 mg  75 mg  Oral Daily Charm Rings, NP   75 mg at 05/11/23 0818   cloNIDine (CATAPRES) tablet 0.1 mg  0.1 mg Oral BH-qamhs Onuoha, Chinwendu V, NP   0.1 mg at 05/11/23 2131   Followed by   Melene Muller ON 05/13/2023] cloNIDine (CATAPRES) tablet 0.1 mg  0.1 mg Oral QAC breakfast Onuoha, Chinwendu V, NP       dicyclomine (BENTYL) tablet 20 mg  20 mg Oral Q6H PRN Onuoha, Chinwendu V, NP   20 mg at 05/09/23 1305   haloperidol (HALDOL) tablet 5 mg  5 mg Oral TID PRN Onuoha, Chinwendu V, NP       And   diphenhydrAMINE (BENADRYL) capsule 50 mg  50 mg Oral TID PRN Onuoha, Chinwendu V, NP       gabapentin (NEURONTIN) capsule 300 mg  300 mg Oral QID Charm Rings, NP   300 mg at 05/12/23 1223   hydrOXYzine  (ATARAX) tablet 25 mg  25 mg Oral Q6H PRN Onuoha, Chinwendu V, NP   25 mg at 05/10/23 1019   loperamide (IMODIUM) capsule 2-4 mg  2-4 mg Oral PRN Onuoha, Chinwendu V, NP   2 mg at 05/10/23 1632   magnesium hydroxide (MILK OF MAGNESIA) suspension 30 mL  30 mL Oral Daily PRN Onuoha, Chinwendu V, NP   30 mL at 05/08/23 1322   methocarbamol (ROBAXIN) tablet 500 mg  500 mg Oral Q8H PRN Myriam Forehand, NP       naproxen (NAPROSYN) tablet 500 mg  500 mg Oral BID WC Charm Rings, NP   500 mg at 05/12/23 8657   nicotine polacrilex (NICORETTE) gum 2 mg  2 mg Oral Q4H while awake Myriam Forehand, NP   2 mg at 05/12/23 1310   ondansetron (ZOFRAN-ODT) disintegrating tablet 8 mg  8 mg Oral Q8H PRN Myriam Forehand, NP       QUEtiapine (SEROQUEL) tablet 50 mg  50 mg Oral BID PRN Charm Rings, NP   50 mg at 05/11/23 2133   traZODone (DESYREL) tablet 100 mg  100 mg Oral QHS Charm Rings, NP   100 mg at 05/11/23 2132    Lab Results:  Results for orders placed or performed during the hospital encounter of 05/08/23 (from the past 48 hour(s))  Lipid panel     Status: Abnormal   Collection Time: 05/11/23  6:57 AM  Result Value Ref Range   Cholesterol 134 0 - 200 mg/dL   Triglycerides 82 <846 mg/dL   HDL 29 (L) >96 mg/dL   Total CHOL/HDL Ratio 4.6 RATIO   VLDL 16 0 - 40 mg/dL   LDL Cholesterol 89 0 - 99 mg/dL    Comment:        Total Cholesterol/HDL:CHD Risk Coronary Heart Disease Risk Table                     Men   Women  1/2 Average Risk   3.4   3.3  Average Risk       5.0   4.4  2 X Average Risk   9.6   7.1  3 X Average Risk  23.4   11.0        Use the calculated Patient Ratio above and the CHD Risk Table to determine the patient's CHD Risk.        ATP III CLASSIFICATION (LDL):  <100     mg/dL   Optimal  295-284  mg/dL   Near  or Above                    Optimal  130-159  mg/dL   Borderline  540-981  mg/dL   High  >191     mg/dL   Very High Performed at [facility], [address] [place], [place] [address]   Hemoglobin A1c     Status: None   Collection Time: 05/11/23  6:57 AM  Result Value Ref Range   Hgb A1c MFr Bld 5.2 4.8 - 5.6 %    Comment: (NOTE) Pre diabetes:          5.7%-6.4%  Diabetes:              >6.4%  Glycemic control for   <7.0% adults with diabetes    Mean Plasma Glucose 102.54 mg/dL    Comment: Performed at [facility], [address] [place], [place] [address]    Blood Alcohol level:  Lab Results  Component Value Date   [facility] <10 05/07/2023   ETH <5 10/24/2015    Metabolic Disorder Labs: Lab Results  Component Value Date   HGBA1C 5.2 05/11/2023   MPG 102.54 05/11/2023   No results found for: "PROLACTIN" Lab Results  Component Value Date   CHOL 134 05/11/2023   TRIG 82 05/11/2023   HDL 29 (L) 05/11/2023   CHOLHDL 4.6 05/11/2023   VLDL 16 05/11/2023   LDLCALC 89 05/11/2023    Physical Findings: AIMS:  , ,  ,  ,    CIWA:    COWS:  COWS Total Score: 2  Musculoskeletal: Strength & Muscle Tone: within normal limits Gait & Station: normal Patient leans: N/A  Psychiatric Specialty Exam:  Presentation  General Appearance:  Disheveled  Eye Contact: None  Speech: Clear and Coherent  Speech Volume: Decreased  Handedness: Right   Mood and Affect  Mood: Depressed  Affect: Flat   Thought Process  Thought Processes: Coherent  Descriptions of Associations:Intact  Orientation:Full (Time, Place and Person) (and situation)  Thought Content:WDL  History of Schizophrenia/Schizoaffective disorder: none reported Duration of Psychotic Symptoms:none reported Hallucinations:Hallucinations: None  Ideas of Reference:None  Suicidal Thoughts:Suicidal Thoughts: -- (denies)  Homicidal Thoughts:Homicidal Thoughts: -- (denies)   Sensorium  Memory: Immediate Good; Remote Good  Judgment: Poor  Insight: Poor   Art therapist  Concentration: Fair  Attention  Span: Fair  Recall: Fair  Fund of Knowledge: Good  Language: Good   Psychomotor Activity  Psychomotor Activity:Psychomotor Activity: Normal   Assets  Assets: Communication Skills   Sleep  Sleep:Sleep: Fair Number of Hours of Sleep: 6    Physical Exam: Physical Exam Vitals and nursing note reviewed.  Constitutional:      Appearance: Normal appearance.  HENT:     Head: Normocephalic and atraumatic.     Nose: Nose normal.  Pulmonary:     Effort: Pulmonary effort is normal.  Musculoskeletal:        General: Normal range of motion.     Cervical back: Normal range of motion.  Neurological:     General: No focal deficit present.     Mental Status: He is alert and oriented to person, place, and time. Mental status is at baseline.  Psychiatric:        Attention and Perception: Attention and perception normal.        Mood and Affect: Mood is depressed. Affect is flat.        Speech: Speech normal.  Behavior: Behavior is uncooperative and withdrawn.        Thought Content: Thought content normal.        Cognition and Memory: Cognition and memory normal.        Judgment: Judgment is impulsive.    Review of Systems  Gastrointestinal:  Positive for heartburn and nausea.  All other systems reviewed and are negative.  Blood pressure 125/88, pulse 73, temperature 98.1 F (36.7 C), resp. rate 16, height 5\' 11"  (1.803 m), weight 71.2 kg, SpO2 99%. Body mass index is 21.9 kg/m.   Treatment Plan Summary: Daily contact with patient to assess and evaluate symptoms and progress in treatment and Medication management Patient is pending acceptance into a rehabilitation center for further treatment of mental health and substance use concerns Trazodone 100 mg QHS for sleep. Gabapentin 300 mg QID for mood stabilization. Hydroxyzine 25 mg PRN for anxiety. Ondansetron 4 mg PRN for nausea. Quetiapine 50 mg BID PRN to support mood stabilization and rest Myriam Forehand,  NP 05/12/2023, 2:01 PM

## 2023-05-11 NOTE — Group Note (Signed)
LCSW Group Therapy Note  Group Date: 05/11/2023 Start Time: 1330 End Time: 1430   Type of Therapy and Topic:  Group Therapy - Healthy vs Unhealthy Coping Skills  Participation Level:  None   Description of Group The focus of this group was to determine what unhealthy coping techniques typically are used by group members and what healthy coping techniques would be helpful in coping with various problems. Patients were guided in becoming aware of the differences between healthy and unhealthy coping techniques. Patients were asked to identify 2-3 healthy coping skills they would like to learn to use more effectively.  Therapeutic Goals Patients learned that coping is what human beings do all day long to deal with various situations in their lives Patients defined and discussed healthy vs unhealthy coping techniques Patients identified their preferred coping techniques and identified whether these were healthy or unhealthy Patients determined 2-3 healthy coping skills they would like to become more familiar with and use more often. Patients provided support and ideas to each other   Summary of Patient Progress:   Patient was present in group, however, was not an active participant and did not engage in group discussions.    Therapeutic Modalities Cognitive Behavioral Therapy Motivational Interviewing  Claudie Fisherman 05/11/2023  3:05 PM

## 2023-05-11 NOTE — BH IP Treatment Plan (Signed)
Interdisciplinary Treatment and Diagnostic Plan Update  05/11/2023 Time of Session: 9:27AM [name] [name] MRN: [id number]  Principal Diagnosis: Major depressive disorder, recurrent severe without psychotic features (HCC)  Secondary Diagnoses: Principal Problem:   Major depressive disorder, recurrent severe without psychotic features (HCC) Active Problems:   Polysubstance abuse (HCC)   Current Medications:  Current Facility-Administered Medications  Medication Dose Route Frequency Provider Last Rate Last Admin   acetaminophen (TYLENOL) tablet 650 mg  650 mg Oral Q6H PRN Onuoha, Chinwendu V, NP       alum & mag hydroxide-simeth (MAALOX/MYLANTA) 200-200-20 MG/5ML suspension 30 mL  30 mL Oral Q4H PRN Onuoha, Chinwendu V, NP   30 mL at 05/10/23 1235   buPROPion (WELLBUTRIN) tablet 75 mg  75 mg Oral Daily Charm Rings, NP   75 mg at 05/11/23 0818   cloNIDine (CATAPRES) tablet 0.1 mg  0.1 mg Oral BH-qamhs Onuoha, Chinwendu V, NP       Followed by   Melene Muller ON 05/13/2023] cloNIDine (CATAPRES) tablet 0.1 mg  0.1 mg Oral QAC breakfast Onuoha, Chinwendu V, NP       dicyclomine (BENTYL) tablet 20 mg  20 mg Oral Q6H PRN Onuoha, Chinwendu V, NP   20 mg at 05/09/23 1305   haloperidol (HALDOL) tablet 5 mg  5 mg Oral TID PRN Onuoha, Chinwendu V, NP       And   diphenhydrAMINE (BENADRYL) capsule 50 mg  50 mg Oral TID PRN Onuoha, Chinwendu V, NP       gabapentin (NEURONTIN) capsule 300 mg  300 mg Oral QID Charm Rings, NP   300 mg at 05/11/23 0818   hydrOXYzine (ATARAX) tablet 25 mg  25 mg Oral Q6H PRN Onuoha, Chinwendu V, NP   25 mg at 05/10/23 1019   loperamide (IMODIUM) capsule 2-4 mg  2-4 mg Oral PRN Onuoha, Chinwendu V, NP   2 mg at 05/10/23 1632   magnesium hydroxide (MILK OF MAGNESIA) suspension 30 mL  30 mL Oral Daily PRN Onuoha, Chinwendu V, NP   30 mL at 05/08/23 1322   methocarbamol (ROBAXIN) tablet 500 mg  500 mg Oral TID Charm Rings, NP   500 mg at 05/09/23 1707   naproxen  (NAPROSYN) tablet 500 mg  500 mg Oral BID WC Charm Rings, NP   500 mg at 05/11/23 0818   nicotine polacrilex (NICORETTE) gum 2 mg  2 mg Oral PRN Sarina Ill, DO   2 mg at 05/11/23 0820   ondansetron (ZOFRAN-ODT) disintegrating tablet 4 mg  4 mg Oral Q6H PRN Onuoha, Chinwendu V, NP   4 mg at 05/10/23 1858   QUEtiapine (SEROQUEL) tablet 50 mg  50 mg Oral BID PRN Charm Rings, NP   50 mg at 05/11/23 0817   traZODone (DESYREL) tablet 100 mg  100 mg Oral QHS Charm Rings, NP   100 mg at 05/10/23 2157   PTA Medications: No medications prior to admission.    Patient Stressors: Medication change or noncompliance   Substance abuse    Patient Strengths: Ability for insight  Forensic psychologist fund of knowledge  Motivation for treatment/growth  Supportive family/friends   Treatment Modalities: Medication Management, Group therapy, Case management,  1 to 1 session with clinician, Psychoeducation, Recreational therapy.   Physician Treatment Plan for Primary Diagnosis: Major depressive disorder, recurrent severe without psychotic features (HCC) Long Term Goal(s): Improvement in symptoms so as ready for discharge   Short Term Goals: Ability  to identify changes in lifestyle to reduce recurrence of condition will improve Ability to verbalize feelings will improve Ability to disclose and discuss suicidal ideas Ability to demonstrate self-control will improve Ability to identify and develop effective coping behaviors will improve Ability to maintain clinical measurements within normal limits will improve Compliance with prescribed medications will improve Ability to identify triggers associated with substance abuse/mental health issues will improve  Medication Management: Evaluate patient's response, side effects, and tolerance of medication regimen.  Therapeutic Interventions: 1 to 1 sessions, Unit Group sessions and Medication administration.  Evaluation of  Outcomes: Not Met  Physician Treatment Plan for Secondary Diagnosis: Principal Problem:   Major depressive disorder, recurrent severe without psychotic features (HCC) Active Problems:   Polysubstance abuse (HCC)  Long Term Goal(s): Improvement in symptoms so as ready for discharge   Short Term Goals: Ability to identify changes in lifestyle to reduce recurrence of condition will improve Ability to verbalize feelings will improve Ability to disclose and discuss suicidal ideas Ability to demonstrate self-control will improve Ability to identify and develop effective coping behaviors will improve Ability to maintain clinical measurements within normal limits will improve Compliance with prescribed medications will improve Ability to identify triggers associated with substance abuse/mental health issues will improve     Medication Management: Evaluate patient's response, side effects, and tolerance of medication regimen.  Therapeutic Interventions: 1 to 1 sessions, Unit Group sessions and Medication administration.  Evaluation of Outcomes: Not Met   RN Treatment Plan for Primary Diagnosis: Major depressive disorder, recurrent severe without psychotic features (HCC) Long Term Goal(s): Knowledge of disease and therapeutic regimen to maintain health will improve  Short Term Goals: Ability to verbalize frustration and anger appropriately will improve, Ability to demonstrate self-control, Ability to participate in decision making will improve, Ability to verbalize feelings will improve, Ability to identify and develop effective coping behaviors will improve, and Compliance with prescribed medications will improve  Medication Management: RN will administer medications as ordered by provider, will assess and evaluate patient's response and provide education to patient for prescribed medication. RN will report any adverse and/or side effects to prescribing provider.  Therapeutic Interventions: 1  on 1 counseling sessions, Psychoeducation, Medication administration, Evaluate responses to treatment, Monitor vital signs and CBGs as ordered, Perform/monitor CIWA, COWS, AIMS and Fall Risk screenings as ordered, Perform wound care treatments as ordered.  Evaluation of Outcomes: Not Met   LCSW Treatment Plan for Primary Diagnosis: Major depressive disorder, recurrent severe without psychotic features (HCC) Long Term Goal(s): Safe transition to appropriate next level of care at discharge, Engage patient in therapeutic group addressing interpersonal concerns.  Short Term Goals: Engage patient in aftercare planning with referrals and resources, Increase social support, Increase ability to appropriately verbalize feelings, Increase emotional regulation, Facilitate acceptance of mental health diagnosis and concerns, Facilitate patient progression through stages of change regarding substance use diagnoses and concerns, and Identify triggers associated with mental health/substance abuse issues  Therapeutic Interventions: Assess for all discharge needs, 1 to 1 time with Social worker, Explore available resources and support systems, Assess for adequacy in community support network, Educate family and significant other(s) on suicide prevention, Complete Psychosocial Assessment, Interpersonal group therapy.  Evaluation of Outcomes: Not Met   Progress in Treatment: Attending groups: Yes. and No. Participating in groups: Yes. and No. Taking medication as prescribed: Yes. Toleration medication: Yes. Family/Significant other contact made: Yes, individual(s) contacted:  CSW contacted patient's sister with sister's permission.  Patient understands diagnosis: Yes. Discussing patient identified problems/goals  with staff: Yes. Medical problems stabilized or resolved: Yes. and No. Denies suicidal/homicidal ideation: Yes. Issues/concerns per patient self-inventory: Yes. Other: None  New problem(s)  identified: Yes, Describe:  Patient does not want to go to inpatient rather outpatient.   New Short Term/Long Term Goal(s):detox, elimination of symptoms of psychosis, medication management for mood stabilization; elimination of SI thoughts; development of comprehensive mental wellness/sobriety plan.    Patient Goals:  "Get my head straight."  Discharge Plan or Barriers: CSW to assist in the planning of appropriate discharge plan.   Reason for Continuation of Hospitalization: Aggression Depression Mania Medical Issues Medication stabilization Suicidal ideation Withdrawal symptoms  Estimated Length of Stay:1-7 days.   Last 3 [place] Suicide Severity Risk Score: Flowsheet Row Admission (Current) from 05/08/2023 in [facility] INPATIENT BEH[facility]ICINE ED from 05/07/2023 in [facility] at [facility] Admission (Discharged) from 08/02/2021 in [place] PERIOPERATIVE AREA  C-SSRS RISK CATEGORY High Risk High Risk No Risk       Last PHQ 2/9 Scores:     No data to display          Scribe for Treatment Team: Lowry Ram, LCSW 05/11/2023 10:10 AM

## 2023-05-11 NOTE — Group Note (Signed)
Date:  05/11/2023 Time:  10:08 PM  Group Topic/Focus:  Wrap-Up Group:   The focus of this group is to help patients review their daily goal of treatment and discuss progress on daily workbooks.    Participation Level:  Did Not Attend   Maglione,Gianny Killman E 05/11/2023, 10:08 PM

## 2023-05-11 NOTE — Group Note (Signed)
Date:  05/11/2023 Time:  5:47 PM  Group Topic/Focus:  Dimensions of Wellness:   The focus of this group is to introduce the topic of wellness and discuss the role each dimension of wellness plays in total health. Goals Group:   The focus of this group is to help patients establish daily goals to achieve during treatment and discuss how the patient can incorporate goal setting into their daily lives to aide in recovery.    Participation Level:  Did Not Attend   Rosaura Carpenter 05/11/2023, 5:47 PM

## 2023-05-11 NOTE — Plan of Care (Signed)
Patient stated that he is feeling better than yesterday.Verbalized minimal withdrawal symptoms. Patient more visible in the milieu and attended some group. Denies SI,HI and AVH. Appetite and energy level fair. Support and encouragement given.

## 2023-05-11 NOTE — Group Note (Signed)
Date:  05/11/2023 Time:  5:55 PM  Group Topic/Focus:  Healthy Communication:   The focus of this group is to discuss communication, barriers to communication, as well as healthy ways to communicate with others. Outdoor recreation structured group activity    Participation Level:  Active  Participation Quality:  Appropriate  Affect:  Appropriate  Cognitive:  Appropriate  Insight: Appropriate  Engagement in Group:  Developing/Improving and Engaged  Modes of Intervention:  Activity  Additional Comments:    [name] [name] 05/11/2023, 5:55 PM

## 2023-05-11 NOTE — Plan of Care (Signed)
Problem: Education: Goal: Mental status will improve Outcome: Progressing   Problem: Education: Goal: Knowledge of [facility] Education information/materials will improve Outcome: Progressing

## 2023-05-11 NOTE — Group Note (Signed)
Recreation Therapy Group Note   Group Topic:Coping Skills  Group Date: 05/11/2023 Start Time: 1000 End Time: 1100 Facilitators: Rosina Lowenstein, LRT, CTRS Location:  Craft Room  Group Description: Mind Map.  Patient was provided a blank template of a diagram with 32 blank boxes in a tiered system, branching from the center (similar to a bubble chart). LRT directed patients to label the middle of the diagram "Coping Skills". LRT and patients then came up with 8 different coping skills as examples. Pt were directed to record their coping skills in the 2nd tier boxes closest to the center.  Patients would then share their coping skills with the group as LRT wrote them out. LRT gave a handout of 99 different coping skills at the end of group.   Goal Area(s) Addressed: Patients will be able to define "coping skills". Patient will identify new coping skills.  Patient will increase communication.   Affect/Mood: N/A   Participation Level: Did not attend    Clinical Observations/Individualized Feedback: [name] [name] did not attend group.   Plan: Continue to engage patient in RT group sessions 2-3x/week.   Rosina Lowenstein, LRT, CTRS 05/11/2023 11:42 AM

## 2023-05-12 MED ORDER — METHOCARBAMOL 500 MG PO TABS
500.0000 mg | ORAL_TABLET | Freq: Three times a day (TID) | ORAL | Status: DC | PRN
  Administered 2023-05-12: 500 mg via ORAL

## 2023-05-12 MED ORDER — ONDANSETRON 4 MG PO TBDP
8.0000 mg | ORAL_TABLET | Freq: Three times a day (TID) | ORAL | Status: DC | PRN
  Administered 2023-05-12: 8 mg via ORAL
  Filled 2023-05-12: qty 2

## 2023-05-12 MED ORDER — NICOTINE POLACRILEX 2 MG MT GUM
2.0000 mg | CHEWING_GUM | OROMUCOSAL | Status: DC
  Administered 2023-05-12 – 2023-05-13 (×4): 2 mg via ORAL
  Filled 2023-05-12 (×5): qty 1

## 2023-05-12 NOTE — Group Note (Addendum)
[facility] LCSW Group Therapy Note   Group Date: 05/12/2023 Start Time: 1300 End Time: 1410  Type of Therapy/Topic:  Group Therapy:  Feelings about Diagnosis  Participation Level:  Active   Mood: Appropriate      Description of Group:    This group will allow patients to explore their thoughts and feelings about diagnoses they have received. Patients will be guided to explore their level of understanding and acceptance of these diagnoses. Facilitator will encourage patients to process their thoughts and feelings about the reactions of others to their diagnosis, and will guide patients in identifying ways to discuss their diagnosis with significant others in their lives. This group will be process-oriented, with patients participating in exploration of their own experiences as well as giving and receiving support and challenge from other group members.   Therapeutic Goals: 1. Patient will demonstrate understanding of diagnosis as evidence by identifying two or more symptoms of the disorder:  2. Patient will be able to express two feelings regarding the diagnosis 3. Patient will demonstrate ability to communicate their needs through discussion and/or role plays  Summary of Patient Progress: During group patient was forthcoming with information and offered insight and perspective to his peers. Patient was attentive and engaged appropriately in conversation. Patient shared his personal experience which offered encouragement to peers. Patient was respectful and empathetic to peers. Patient did well adding to the positive group dynamic and receiving feedback from group facilitator and peers.   Therapeutic Modalities:   Cognitive Behavioral Therapy Brief Therapy Feelings Identification    Lowry Ram, LCSW

## 2023-05-12 NOTE — Group Note (Signed)
Date:  05/12/2023 Time:  6:26 PM  Group Topic/Focus:  Healthy Communication:   The focus of this group is to discuss communication, barriers to communication, as well as healthy ways to communicate with others. Outdoor recreation structured activity    Participation Level:  Did Not Attend    Rosaura Carpenter 05/12/2023, 6:26 PM

## 2023-05-12 NOTE — Group Note (Signed)
Recreation Therapy Group Note   Group Topic:Goal Setting  Group Date: 05/12/2023 Start Time: 1000 End Time: 1100 Facilitators: Rosina Lowenstein, LRT, CTRS Location:  Craft Room  Group Description: Product/process development scientist. Patients were given many different magazines, a glue stick, markers, and a piece of cardstock paper. LRT and pts discussed the importance of having goals in life. LRT and pts discussed the difference between short-term and long-term goals, as well as what a SMART goal is. LRT encouraged pts to create a vision board, with images they picked and then cut out with safety scissors from the magazine, for themselves, that capture their short and long-term goals. LRT encouraged pts to show and explain their vision board to the group.   Goal Area(s) Addressed:  Patient will gain knowledge of short vs. long term goals.  Patient will identify goals for themselves. Patient will practice setting SMART goals. Patient will verbalize their goals to LRT and peers.   Affect/Mood: N/A   Participation Level: Did not attend    Clinical Observations/Individualized Feedback: [name] [name] did not attend group.  Plan: Continue to engage patient in RT group sessions 2-3x/week.   Rosina Lowenstein, LRT, CTRS 05/12/2023 11:58 AM

## 2023-05-12 NOTE — Plan of Care (Signed)
  Problem: Education: Goal: Mental status will improve Outcome: Not Progressing Goal: Verbalization of understanding the information provided will improve Outcome: Progressing   Problem: Activity: Goal: Interest or engagement in activities will improve Outcome: Not Progressing

## 2023-05-12 NOTE — Group Note (Signed)
Date:  05/12/2023 Time:  10:31 PM  Group Topic/Focus:  Personal Choices and Values:   The focus of this group is to help patients assess and explore the importance of values in their lives, how their values affect their decisions, how they express their values and what opposes their expression.    Participation Level:  Active  Participation Quality:  Appropriate and Attentive  Affect:  Appropriate and Excited  Cognitive:  Alert and Appropriate  Insight: Appropriate and Good  Engagement in Group:  Developing/Improving and Engaged  Modes of Intervention:  Activity, Clarification, Discussion, Rapport Building, Socialization, and Support  Additional Comments:     Suzan Manon 05/12/2023, 10:31 PM

## 2023-05-12 NOTE — Group Note (Signed)
Date:  05/12/2023 Time:  1:51 PM  Group Topic/Focus:  Emotional Education:   The focus of this group is to discuss what feelings/emotions are, and how they are experienced. Recovery Goals:   The focus of this group is to identify appropriate goals for recovery and establish a plan to achieve them.    Participation Level:  Active  Participation Quality:  Appropriate  Affect:  Appropriate  Cognitive:  Appropriate  Insight: Appropriate  Engagement in Group:  Developing/Improving  Modes of Intervention:  Activity, Discussion, and Education  Additional Comments:    [name] [name] 05/12/2023, 1:51 PM

## 2023-05-12 NOTE — Progress Notes (Signed)
Patient is an involuntary admission to [facility] for [facility] waiting for rehab for polysubstance.  Refused some of his morning meds but took his other daily meds with no problem.  Denies SI, HI, AVH, anxiety and depression. Calm, cooperative, quiet with staff and peers.  Will continue to monitor.

## 2023-05-12 NOTE — Clinical Social Work Note (Signed)
[employer] application and requested documentation sent on patient's behalf.   Reymundo Poll, MSW, LCSWA 05/12/2023 2:12 PM

## 2023-05-12 NOTE — Progress Notes (Signed)
Patient with multiple complaints, anxiety, nausea. Prns requested and given with good relief. Denies SI, Hi, AVH. Patient remains safe on unit with q 15 min checks.

## 2023-05-12 NOTE — Progress Notes (Signed)
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 Writer explained the importance of taking prescribed medications to manage cravings and anxiety, emphasizing how compliance can support recovery and align with rehabilitation expectations. Principal Problem: Major depressive disorder, recurrent severe without psychotic features (HCC) Diagnosis: Principal Problem:   Major depressive disorder, recurrent severe without psychotic features (HCC) Active Problems:   Polysubstance abuse (HCC)  Total Time spent with patient: 1 hour  Past Psychiatric History: Polysubstance Abuse Anxiety  Past Medical History:  Past Medical History:  Diagnosis Date   Hep C w/o coma, chronic (HCC)    Renal disorder     Past Surgical History:  Procedure Laterality Date   HAND SURGERY     HAND SURGERY     INGUINAL HERNIA REPAIR Right 08/02/2021   Procedure: HERNIA REPAIR INGUINAL ADULT WITH MESH;  Surgeon: Lucretia Roers, MD;  Location: [facility];  Service: General;  Laterality: Right;   Family History:  Family History  Problem Relation Age of Onset   Diabetes Mother    Diabetes Father    Family Psychiatric  History: none reported Social History:  Social History   Substance and Sexual Activity  Alcohol Use No   Comment: rarely     Social History   Substance and Sexual Activity  Drug Use No    Social History   Socioeconomic History   Marital status: Legally Separated    Spouse name: Not on file   Number of children: Not on file   Years of education: Not on file   Highest education level: Not on file  Occupational History   Not on file  Tobacco Use   Smoking status: Every Day    Current packs/day: 1.00     Types: Cigarettes   Smokeless tobacco: Never  Vaping Use   Vaping status: Never Used  Substance and Sexual Activity   Alcohol use: No    Comment: rarely   Drug use: No   Sexual activity: Never  Other Topics Concern   Not on file  Social History Narrative   Not on file   Social Determinants of Health   Financial Resource Strain: Not on file  Food Insecurity: No Food Insecurity (05/08/2023)   Hunger Vital Sign    Worried About Running Out of Food in the Last Year: Never true    Ran Out of Food in the Last Year: Never true  Transportation Needs: No Transportation Needs (05/08/2023)   PRAPARE - Administrator, Civil Service (Medical): No    Lack of Transportation (Non-Medical): No  Physical Activity: Not on file  Stress: Not on file  Social Connections: Not on file   Additional Social History:                         Sleep: Fair  Appetite:  Fair  Current Medications: Current Facility-Administered Medications  Medication Dose Route Frequency Provider Last Rate Last Admin   acetaminophen (TYLENOL) tablet 650 mg  650 mg Oral Q6H PRN Onuoha, Chinwendu V, NP       alum & mag hydroxide-simeth (MAALOX/MYLANTA) 200-200-20 MG/5ML suspension 30 mL  30 mL Oral Q4H PRN Onuoha, Chinwendu V, NP   30 mL  at 05/12/23 0822   buPROPion [facility]) tablet 75 mg  75 mg Oral Daily Charm Rings, NP   75 mg at 05/11/23 0818   cloNIDine (CATAPRES) tablet 0.1 mg  0.1 mg Oral BH-qamhs Onuoha, Chinwendu V, NP   0.1 mg at 05/11/23 2131   Followed by   Melene Muller ON 05/13/2023] cloNIDine (CATAPRES) tablet 0.1 mg  0.1 mg Oral QAC breakfast Onuoha, Chinwendu V, NP       dicyclomine (BENTYL) tablet 20 mg  20 mg Oral Q6H PRN Onuoha, Chinwendu V, NP   20 mg at 05/09/23 1305   haloperidol (HALDOL) tablet 5 mg  5 mg Oral TID PRN Onuoha, Chinwendu V, NP       And   diphenhydrAMINE (BENADRYL) capsule 50 mg  50 mg Oral TID PRN Onuoha, Chinwendu V, NP       gabapentin (NEURONTIN) capsule  300 mg  300 mg Oral QID Charm Rings, NP   300 mg at 05/12/23 1223   hydrOXYzine (ATARAX) tablet 25 mg  25 mg Oral Q6H PRN Onuoha, Chinwendu V, NP   25 mg at 05/10/23 1019   loperamide (IMODIUM) capsule 2-4 mg  2-4 mg Oral PRN Onuoha, Chinwendu V, NP   2 mg at 05/10/23 1632   magnesium hydroxide (MILK OF MAGNESIA) suspension 30 mL  30 mL Oral Daily PRN Onuoha, Chinwendu V, NP   30 mL at 05/08/23 1322   methocarbamol (ROBAXIN) tablet 500 mg  500 mg Oral Q8H PRN Myriam Forehand, NP       naproxen (NAPROSYN) tablet 500 mg  500 mg Oral BID WC Charm Rings, NP   500 mg at 05/12/23 7829   nicotine polacrilex (NICORETTE) gum 2 mg  2 mg Oral Q4H while awake Myriam Forehand, NP   2 mg at 05/12/23 1310   ondansetron (ZOFRAN-ODT) disintegrating tablet 8 mg  8 mg Oral Q8H PRN Myriam Forehand, NP       QUEtiapine (SEROQUEL) tablet 50 mg  50 mg Oral BID PRN Charm Rings, NP   50 mg at 05/11/23 2133   traZODone (DESYREL) tablet 100 mg  100 mg Oral QHS Charm Rings, NP   100 mg at 05/11/23 2132    Lab Results:  Results for orders placed or performed during the hospital encounter of 05/08/23 (from the past 48 hour(s))  Lipid panel     Status: Abnormal   Collection Time: 05/11/23  6:57 AM  Result Value Ref Range   Cholesterol 134 0 - 200 mg/dL   Triglycerides 82 <562 mg/dL   HDL 29 (L) >13 mg/dL   Total CHOL/HDL Ratio 4.6 RATIO   VLDL 16 0 - 40 mg/dL   LDL Cholesterol 89 0 - 99 mg/dL    Comment:        Total Cholesterol/HDL:CHD Risk Coronary Heart Disease Risk Table                     Men   Women  1/2 Average Risk   3.4   3.3  Average Risk       5.0   4.4  2 X Average Risk   9.6   7.1  3 X Average Risk  23.4   11.0        Use the calculated Patient Ratio above and the CHD Risk Table to determine the patient's CHD Risk.        ATP III CLASSIFICATION (LDL):  <100  mg/dL   Optimal  829-562  mg/dL   Near or Above                    Optimal  130-159  mg/dL   Borderline  130-865  mg/dL    High  >784     mg/dL   Very High Performed at [facility], [address] [place], [place] [address]   Hemoglobin A1c     Status: None   Collection Time: 05/11/23  6:57 AM  Result Value Ref Range   Hgb A1c MFr Bld 5.2 4.8 - 5.6 %    Comment: (NOTE) Pre diabetes:          5.7%-6.4%  Diabetes:              >6.4%  Glycemic control for   <7.0% adults with diabetes    Mean Plasma Glucose 102.54 mg/dL    Comment: Performed at [facility], [address] [place], [place] [address]    Blood Alcohol level:  Lab Results  Component Value Date   [facility] <10 05/07/2023   ETH <5 10/24/2015    Metabolic Disorder Labs: Lab Results  Component Value Date   HGBA1C 5.2 05/11/2023   MPG 102.54 05/11/2023   No results found for: "PROLACTIN" Lab Results  Component Value Date   CHOL 134 05/11/2023   TRIG 82 05/11/2023   HDL 29 (L) 05/11/2023   CHOLHDL 4.6 05/11/2023   VLDL 16 05/11/2023   LDLCALC 89 05/11/2023    Physical Findings: AIMS:  , ,  ,  ,    CIWA:    COWS:  COWS Total Score: 2  Musculoskeletal: Strength & Muscle Tone: within normal limits Gait & Station: normal Patient leans: N/A  Psychiatric Specialty Exam:  Presentation  General Appearance:  Disheveled  Eye Contact: None  Speech: Clear and Coherent  Speech Volume: Decreased  Handedness: Right   Mood and Affect  Mood: Depressed  Affect: Flat   Thought Process  Thought Processes: Coherent  Descriptions of Associations:Intact  Orientation:Full (Time, Place and Person) (and situation)  Thought Content:WDL  History of Schizophrenia/Schizoaffective disorder:none reported Duration of Psychotic Symptoms:none reported Hallucinations:Hallucinations: None  Ideas of Reference:None  Suicidal Thoughts:Suicidal Thoughts: -- (denies)  Homicidal Thoughts:Homicidal Thoughts: -- (denies)   Sensorium  Memory: Immediate Good; Remote  Good  Judgment: Poor  Insight: Poor   Art therapist  Concentration: Fair  Attention Span: Fair  Recall: Fair  Fund of Knowledge: Good  Language: Good   Psychomotor Activity  Psychomotor Activity: Psychomotor Activity: Normal   Assets  Assets: Communication Skills   Sleep  Sleep: Sleep: Fair Number of Hours of Sleep: 6    Physical Exam: Physical Exam Vitals and nursing note reviewed.  Constitutional:      Appearance: Normal appearance.  HENT:     Head: Normocephalic and atraumatic.     Nose: Nose normal.  Pulmonary:     Effort: Pulmonary effort is normal.  Musculoskeletal:        General: Normal range of motion.     Cervical back: Normal range of motion.  Neurological:     General: No focal deficit present.     Mental Status: He is alert and oriented to person, place, and time. Mental status is at baseline.  Psychiatric:        Attention and Perception: Attention and perception normal.        Mood and Affect: Mood is anxious. Affect is flat.  Speech: Speech normal.        Behavior: Behavior is uncooperative and withdrawn.        Thought Content: Thought content normal.        Cognition and Memory: Cognition and memory normal.        Judgment: Judgment normal.    Review of Systems  Gastrointestinal:  Positive for nausea.  All other systems reviewed and are negative.  Blood pressure 125/88, pulse 73, temperature 98.1 F (36.7 C), resp. rate 16, height 5\' 11"  (1.803 m), weight 71.2 kg, SpO2 99%. Body mass index is 21.9 kg/m.   Treatment Plan Summary: Daily contact with patient to assess and evaluate symptoms and progress in treatment and Medication management Reiterate the importance of Wellbutrin 75 mg daily to prevent cravings and manage depressive symptoms. Continue Clonidine and Zofran as needed. Trazodone 100 mg QHS for sleep. Gabapentin 300 mg QID for mood stabilization. Hydroxyzine 25 mg PRN for anxiety. Ondansetron 4  mg PRN for nausea. Quetiapine 50 mg BID PRN to support mood stabilization and rest. Myriam Forehand, NP 05/12/2023, 2:10 PM

## 2023-05-13 MED ORDER — NICOTINE POLACRILEX 2 MG MT GUM: 2.0000 mg | CHEWING_GUM | OROMUCOSAL | 0 refills | Status: AC

## 2023-05-13 MED ORDER — NAPROXEN 500 MG PO TABS: 500.0000 mg | ORAL_TABLET | Freq: Two times a day (BID) | ORAL | 0 refills | Status: DC

## 2023-05-13 MED ORDER — GABAPENTIN 300 MG PO CAPS: 300.0000 mg | ORAL_CAPSULE | Freq: Two times a day (BID) | ORAL | 0 refills | Status: AC

## 2023-05-13 MED ORDER — QUETIAPINE FUMARATE 50 MG PO TABS: 50.0000 mg | ORAL_TABLET | Freq: Every day | ORAL | 0 refills | Status: AC

## 2023-05-13 MED ORDER — BUPROPION HCL 75 MG PO TABS: 75.0000 mg | ORAL_TABLET | Freq: Every day | ORAL | 0 refills | Status: AC

## 2023-05-13 MED ORDER — TRAZODONE HCL 100 MG PO TABS: 100.0000 mg | ORAL_TABLET | Freq: Every evening | ORAL | 0 refills | Status: AC | PRN

## 2023-05-13 NOTE — Group Note (Signed)
Recreation Therapy Group Note   Group Topic:Emotion Expression  Group Date: 05/13/2023 Start Time: 1045 End Time: 1135 Facilitators: Rosina Lowenstein, LRT, CTRS Location:  Craft Room  Group Description: Gratitude Journaling. Patients and LRT discussed what gratitude means, how we can express it and what it means to [place], personally. LRT gave an education handout on the definition of gratitude that also gave different examples of gratitude exercises that they could try. One of the examples was "Gratitude Letter", which prompted patient to write a letter to someone they appreciate. LRT played soft music while everyone wrote their letter. Once letter was completed, LRT encouraged people to read their letter if they wanted to; or share who they wrote it to, at minimum. LRT and pts talked about the benefits of journaling and how it can be used as a positive coping skill. LRT and pts processed how showing gratitude towards themselves, and others can be applied to everyday life post-discharge. LRT offered journals to pts afterwards.   Goal Area(s) Addressed:  Patient will identify the definition of gratitude. Patient will learn different gratitude exercises. Patient will practice writing/journaling as a coping skill.  Patient will identify a new coping skill.   Affect/Mood: Appropriate and Flat   Participation Level: Engaged   Participation Quality: Independent   Behavior: Calm and Cooperative   Speech/Thought Process: Coherent   Insight: Fair   Judgement: Fair    Modes of Intervention: Activity, Education, and Writing   Patient Response to Interventions:  Engaged and Receptive   Education Outcome:  Acknowledges education   Clinical Observations/Individualized Feedback: [name] [name] was active in their participation of session activities and group discussion. Pt identified "one of my friends" as who he wrote the letter to. Pt came late to group, however, joined with no issue. Pt interacted  well with LRT and peers duration of session.    Plan: Continue to engage patient in RT group sessions 2-3x/week.   Rosina Lowenstein, LRT, CTRS 05/13/2023 12:03 PM

## 2023-05-13 NOTE — Group Note (Signed)
Date:  05/13/2023 Time:  10:08 AM  Group Topic/Focus:  Goals Group:   The focus of this group is to help patients establish daily goals to achieve during treatment and discuss how the patient can incorporate goal setting into their daily lives to aide in recovery.    Participation Level:  Active  Participation Quality:  Appropriate  Affect:  Appropriate  Cognitive:  Appropriate  Insight: Appropriate  Engagement in Group:  Engaged  Modes of Intervention:  Discussion, Education, and Support  Additional Comments:    Wilford Corner 05/13/2023, 10:08 AM

## 2023-05-13 NOTE — Group Note (Signed)
Date:  05/13/2023 Time:  10:09 PM  Group Topic/Focus:  Wrap-Up Group:   The focus of this group is to help patients review their daily goal of treatment and discuss progress on daily workbooks.    Participation Level:  Did Not Attend   Lenore Cordia 05/13/2023, 10:09 PM

## 2023-05-13 NOTE — OR Nursing (Signed)
Patient denies si,hi and avh. PRNS request throughout the shift. PM medications have been given.

## 2023-05-13 NOTE — Plan of Care (Signed)
  Problem: Education: Goal: Knowledge of [facility] Education information/materials will improve Outcome: Adequate for Discharge Goal: Emotional status will improve Outcome: Adequate for Discharge Goal: Mental status will improve Outcome: Adequate for Discharge Goal: Verbalization of understanding the information provided will improve Outcome: Adequate for Discharge   Problem: Activity: Goal: Interest or engagement in activities will improve Outcome: Adequate for Discharge Goal: Sleeping patterns will improve Outcome: Adequate for Discharge   Problem: Coping: Goal: Ability to verbalize frustrations and anger appropriately will improve Outcome: Adequate for Discharge Goal: Ability to demonstrate self-control will improve Outcome: Adequate for Discharge   Problem: Health Behavior/Discharge Planning: Goal: Identification of resources available to assist in meeting health care needs will improve Outcome: Adequate for Discharge Goal: Compliance with treatment plan for underlying cause of condition will improve Outcome: Adequate for Discharge   Problem: Physical Regulation: Goal: Ability to maintain clinical measurements within normal limits will improve Outcome: Adequate for Discharge   Problem: Safety: Goal: Periods of time without injury will increase Outcome: Adequate for Discharge   Problem: Physical Regulation: Goal: Complications related to the disease process, condition or treatment will be avoided or minimized Outcome: Adequate for Discharge   Problem: Safety: Goal: Ability to remain free from injury will improve Outcome: Adequate for Discharge   Problem: Coping: Goal: Coping ability will improve Outcome: Adequate for Discharge   Problem: Self-Concept: Goal: Ability to disclose and discuss suicidal ideas will improve Outcome: Adequate for Discharge Goal: Will verbalize positive feelings about self Outcome: Adequate for Discharge

## 2023-05-13 NOTE — Group Note (Signed)
Date:  05/13/2023 Time:  5:27 PM  Group Topic/Focus:  Self Care:   The focus of this group is to help patients understand the importance of self-care in order to improve or restore emotional, physical, spiritual, interpersonal, and financial health.      Participation Level:  Active  Participation Quality:  Appropriate  Affect:  Appropriate  Cognitive:  Appropriate  Insight: Appropriate  Engagement in Group:  Engaged  Modes of Intervention:  Activity and Socialization  Additional Comments:    Wilford Corner 05/13/2023, 5:27 PM

## 2023-05-13 NOTE — Progress Notes (Signed)
   05/13/23 1300  Psych Admission Type (Psych Patients Only)  Admission Status Involuntary  Psychosocial Assessment  Patient Complaints None  Eye Contact Fair  Facial Expression Flat  Affect Anxious  Speech Logical/coherent  Interaction Assertive  Motor Activity Slow  Appearance/Hygiene Disheveled;Poor hygiene;Layered clothes  Behavior Characteristics Resistant to care (patient refused scheduled medication)  Mood Pleasant  Aggressive Behavior  Effect No apparent injury  Thought Process  Coherency Circumstantial  Content WDL  Delusions None reported or observed  Perception WDL  Hallucination None reported or observed  Judgment WDL  Confusion None  Danger to Self  Current suicidal ideation? Denies  Agreement Not to Harm Self Yes  Description of Agreement Verbal  Danger to Others  Danger to Others None reported or observed

## 2023-05-13 NOTE — [facility] Suicide Risk Assessment (Signed)
[facility] Discharge Suicide Risk Assessment   Principal Problem: Major depressive disorder, recurrent severe without psychotic features (HCC) Discharge Diagnoses: Principal Problem:   Major depressive disorder, recurrent severe without psychotic features (HCC) Active Problems:   Polysubstance abuse (HCC)   Total Time spent with patient: 2 hours  Musculoskeletal: Strength & Muscle Tone: within normal limits Gait & Station: normal Patient leans: N/A  Psychiatric Specialty Exam  Presentation  General Appearance:  Fairly Groomed  Eye Contact: Fleeting  Speech: Clear and Coherent  Speech Volume: Normal  Handedness: Right   Mood and Affect  Mood: Euthymic  Duration of Depression Symptoms: No data recorded Affect: Flat   Thought Process  Thought Processes: Coherent  Descriptions of Associations:Intact  Orientation:Full (Time, Place and Person) (and situation)  Thought Content:WDL  History of Schizophrenia/Schizoaffective disorder:No data recorded Duration of Psychotic Symptoms:No data recorded Hallucinations:Hallucinations: None  Ideas of Reference:None  Suicidal Thoughts:Suicidal Thoughts: No  Homicidal Thoughts:Homicidal Thoughts: No   Sensorium  Memory: Immediate Good; Remote Good  Judgment: Fair  Insight: Fair   Art therapist  Concentration: Good  Attention Span: Fair  Recall: Good  Fund of Knowledge: Good  Language: Good   Psychomotor Activity  Psychomotor Activity: Psychomotor Activity: Normal   Assets  Assets: Communication Skills; Desire for Improvement; Housing   Sleep  Sleep: Sleep: Good Number of Hours of Sleep: 7   Physical Exam: Physical Exam Vitals and nursing note reviewed.  Constitutional:      Appearance: Normal appearance.  HENT:     Head: Normocephalic and atraumatic.     Nose: Nose normal.  Pulmonary:     Effort: Pulmonary effort is normal.  Musculoskeletal:        General: Normal  range of motion.     Cervical back: Normal range of motion.  Neurological:     General: No focal deficit present.     Mental Status: He is alert and oriented to person, place, and time. Mental status is at baseline.  Psychiatric:        Attention and Perception: Attention and perception normal.        Mood and Affect: Mood and affect normal.        Speech: Speech normal.        Behavior: Behavior normal. Behavior is cooperative.        Thought Content: Thought content normal.        Cognition and Memory: Cognition and memory normal.        Judgment: Judgment normal.    ROS Blood pressure (!) 143/76, pulse (!) 58, temperature 97.9 F (36.6 C), resp. rate 17, height 5\' 11"  (1.803 m), weight 71.2 kg, SpO2 100%. Body mass index is 21.9 kg/m.  Mental Status Per Nursing Assessment::   On Admission:  NA  Demographic Factors:  Male, Divorced or widowed, Low socioeconomic status, and Unemployed  Loss Factors: Financial problems/change in socioeconomic status  Historical Factors: Family history of mental illness or substance abuse and Impulsivity  Risk Reduction Factors:   Living with another person, especially a relative and Positive coping skills or problem solving skills  Continued Clinical Symptoms:  Depression:   Impulsivity Alcohol/Substance Abuse/Dependencies  Cognitive Features That Contribute To Risk:  None    Suicide Risk:  Minimal: No identifiable suicidal ideation.  Patients presenting with no risk factors but with morbid ruminations; may be classified as minimal risk based on the severity of the depressive symptoms   Follow-up Information     [facility] .   Why:  Referral has been sent, you can follow up on the status of the application. Contact information: [address] [place] [address] [phone]         Services, [facility]. Go to.   Why: Appointment schedualed for 05/22/23 at 11AM. Contact information: [address] [place]  [address] [phone]                 Plan Of Care/Follow-up recommendations:  Activity:  as tolerated Diet:  heart healthy Bupropion (Wellbutrin) 75 mg daily: For depression and withdrawal symptoms. Clonidine (Catapres) 0.1 mg QID: For withdrawal management and anxiety. Gabapentin (Neurontin) 300 mg QID: For anxiety and withdrawal symptoms. Trazodone (Desyrel) 100 mg QHS: For sleep. Hydroxyzine (Atarax) 25 mg Q6H PRN: For anxiety or agitation. Naproxen (Naprosyn) 500 mg BID PRN: For pain. Nicotine Gum 2 mg PRN: For smoking cessation. [facility]: Appointment on 05/22/23 at 11 AM Address: [address], [place], [place] [address] Phone: [phone] [facility] Services: Referral submitted for further support. Address: [address] [place], [place] [address] Phone: [phone] National Suicide Prevention Lifeline: 988 (available 24/7). Mobile Crisis Team: 2724111458 (available 24/7). Emergency Services: Instruct the patient to call 911 or visit the nearest emergency room for immediate safety concerns. Discuss the risks of relapse and emphasize the need for continued outpatient care. Develop a safety plan, including identifying supportive individuals and resources to contact during a crisis. Recommend follow-up with a primary care provider to address medical issues, including poor appetite and sleep disturbances. Myriam Forehand, NP 05/13/2023, 5:30 PM

## 2023-05-13 NOTE — Progress Notes (Signed)
Pt was discharged 11 PM via transport from a friend who met him in the ED parking area.  Initial planned transportation fell through and Pt and [facility] staff worked to find alternative. Even though pt had an address and had home as the transport destination pt stated he is homeless. Pt was irritable and moderately agitated at times requesting to be discharged to a shelter or just allowed to walk without transport.  This Clinical research associate assured pt steps were taken to find transport if at all possible. While alternatives were being discussed, pt reached an acquaintance who was willing to transport him and came to pick him up .  Pt's belongings were returned to pt; discharge instructions and AVS had been reviewed with pt by day shift RN. Pt thanked staff for efforts to find transportation.

## 2023-05-13 NOTE — Progress Notes (Signed)
  [facility] Adult Case Management Discharge Plan :  Will you be returning to the same living situation after discharge:  Yes,  pt reports that he is returning home At discharge, do you have transportation home?: Yes,  pt reports that a friend can provide transportation.  Do you have the ability to pay for your medications: Yes,  [employer]  Release of information consent forms completed and in the chart;  Patient's signature needed at discharge.  Patient to Follow up at:  Follow-up Information     [facility] .   Why: Referral has been sent, you can follow up on the status of the application. Contact information: [address] [place] [address] [phone]         Services, [facility]. Go to.   Why: Appointment schedualed for 05/22/23 at 11AM. Contact information: [address] [place] [address] [phone]                 Next level of care provider has access to [facility] Link:no  Safety Planning and Suicide Prevention discussed: Yes,  SPE completed with the patient's sister.     Has patient been referred to the Quitline?: Patient refused referral for treatment  Patient has been referred for addiction treatment: Yes, referral information given but appointment not made referral sent to [facility], patient to follow up. (list facility).  Harden Mo, LCSW 05/13/2023, 1:59 PM

## 2023-05-13 NOTE — Plan of Care (Signed)
  Problem: Activity: Goal: Interest or engagement in activities will improve Outcome: Progressing Goal: Sleeping patterns will improve Outcome: Progressing   Problem: Education: Goal: Knowledge of [facility] Education information/materials will improve Outcome: Progressing Goal: Verbalization of understanding the information provided will improve Outcome: Progressing

## 2023-05-13 NOTE — Discharge Summary (Signed)
Physician Discharge Summary Note  Patient:  [name] [name] is an 47 y.o., male MRN:  [id number] DOB:  1976/05/03 Patient phone:  [phone] (home)  Patient address:   [address] [place] [address],  Total Time spent with patient: 2 hours  Date of Admission:  05/08/2023 Date of Discharge: 05/13/2023  Reason for Admission:  47 year old Caucasian male with a history of severe substance use disorder (fentanyl, cocaine, benzodiazepines, and methamphetamines), was admitted under Involuntary Commitment (IVC) due to suicidal ideation and a recent suicide attempt by fentanyl overdose. The patient reported escalating substance use over the past 20+ years and a recent inability to cope with withdrawal symptoms, grief from multiple personal losses (girlfriend, stepdaughter, and mother within the past 18 months), and housing instability.At the time of admission, the patient endorsed passive suicidal ideation, stating he would "rather be dead" than continue in his current condition. He reported poor sleep and appetite due to substance use and withdrawal, along with a lack of social support and motivation to seek help independently. Admission was deemed necessary to ensure safety, manage withdrawal symptoms, and stabilize his mental and physical health.  Principal Problem: Major depressive disorder, recurrent severe without psychotic features [facility]) Discharge Diagnoses: Principal Problem:   Major depressive disorder, recurrent severe without psychotic features (HCC) Active Problems:   Polysubstance abuse (HCC)   Past Psychiatric History: Anxiety Polysubstance Abuse  Past Medical History:  Past Medical History:  Diagnosis Date   Hep C w/o coma, chronic (HCC)    Renal disorder     Past Surgical History:  Procedure Laterality Date   HAND SURGERY     HAND SURGERY     INGUINAL HERNIA REPAIR Right 08/02/2021   Procedure: HERNIA REPAIR INGUINAL ADULT WITH MESH;  Surgeon: Lucretia Roers, MD;   Location: [facility];  Service: General;  Laterality: Right;   Family History:  Family History  Problem Relation Age of Onset   Diabetes Mother    Diabetes Father    Family Psychiatric  History: none Social History:  Social History   Substance and Sexual Activity  Alcohol Use No   Comment: rarely     Social History   Substance and Sexual Activity  Drug Use No    Social History   Socioeconomic History   Marital status: Legally Separated    Spouse name: Not on file   Number of children: Not on file   Years of education: Not on file   Highest education level: Not on file  Occupational History   Not on file  Tobacco Use   Smoking status: Every Day    Current packs/day: 1.00    Types: Cigarettes   Smokeless tobacco: Never  Vaping Use   Vaping status: Never Used  Substance and Sexual Activity   Alcohol use: No    Comment: rarely   Drug use: No   Sexual activity: Never  Other Topics Concern   Not on file  Social History Narrative   Not on file   Social Determinants of Health   Financial Resource Strain: Not on file  Food Insecurity: No Food Insecurity (05/08/2023)   Hunger Vital Sign    Worried About Running Out of Food in the Last Year: Never true    Ran Out of Food in the Last Year: Never true  Transportation Needs: No Transportation Needs (05/08/2023)   PRAPARE - Administrator, Civil Service (Medical): No    Lack of Transportation (Non-Medical): No  Physical Activity: Not  on file  Stress: Not on file  Social Connections: Not on file    Hospital Course:  The patient with a history of severe substance use disorder (fentanyl, cocaine, benzodiazepines, methamphetamines), was admitted under Involuntary Commitment (IVC) for suicidal ideation and a recent attempt to overdose on fentanyl. Upon admission, the patient was in moderate distress due to withdrawal symptoms, expressing a desire for help with his substance use but also displaying passive suicidal  ideation. He denied homicidal ideation, psychosis, or active hallucinations and was cooperative during initial evaluations. COWS initiated Clonidine (0.1 mg QID) was initiated to manage withdrawal symptoms, with regular monitoring of vital signs and symptoms.The patient engaged minimally in therapeutic activities but expressed an interest in addressing his substance use disorder. He reported ongoing grief from the loss of his girlfriend, stepdaughter, and mother within the past 18 months, which compounded his feelings of hopelessness. Supportive counseling sessions focused on crisis intervention, emotional processing, and motivational enhancement for recovery.The patient responded positively to the structured inpatient environment, though he expressed frustration about the duration of his stay and voiced repeated requests for discharge.The patient's withdrawal symptoms decreased significantly with the medication regimen, and he denied active cravings or suicidal ideation by the time of discharge planning. He demonstrated an improved mood and increased insight into his need for ongoing treatment but remained ambivalent about fully engaging in post-discharge care.The patient was discharged in a stable condition, denying suicidal or homicidal ideation, and agreed to engage in outpatient services to support his recovery journey.   COWS:  COWS Total Score: 0  Musculoskeletal: Strength & Muscle Tone: within normal limits Gait & Station: normal Patient leans: N/A   Psychiatric Specialty Exam:  Presentation  General Appearance:  Fairly Groomed  Eye Contact: Fleeting  Speech: Clear and Coherent  Speech Volume: Normal  Handedness: Right   Mood and Affect  Mood: Euthymic  Affect: Flat   Thought Process  Thought Processes: Coherent  Descriptions of Associations:Intact  Orientation:Full (Time, Place and Person) (and situation)  Thought Content:WDL  History of  Schizophrenia/Schizoaffective disorder:No data recorded Duration of Psychotic Symptoms:No data recorded Hallucinations:Hallucinations: None  Ideas of Reference:None  Suicidal Thoughts:Suicidal Thoughts: No  Homicidal Thoughts:Homicidal Thoughts: No   Sensorium  Memory: Immediate Good; Remote Good  Judgment: Fair  Insight: Fair   Art therapist  Concentration: Good  Attention Span: Fair  Recall: Good  Fund of Knowledge: Good  Language: Good   Psychomotor Activity  Psychomotor Activity: Psychomotor Activity: Normal   Assets  Assets: Communication Skills; Desire for Improvement; Housing   Sleep  Sleep: Sleep: Good Number of Hours of Sleep: 7    Physical Exam: Physical Exam Vitals and nursing note reviewed.  HENT:     Head: Normocephalic and atraumatic.     Nose: Nose normal.  Pulmonary:     Effort: Pulmonary effort is normal.  Musculoskeletal:        General: Normal range of motion.     Cervical back: Normal range of motion.  Neurological:     General: No focal deficit present.     Mental Status: He is oriented to person, place, and time. Mental status is at baseline.  Psychiatric:        Attention and Perception: Attention and perception normal.        Mood and Affect: Mood and affect normal.        Speech: Speech normal.        Behavior: Behavior normal. Behavior is cooperative.  Thought Content: Thought content normal.        Cognition and Memory: Cognition and memory normal.        Judgment: Judgment normal.    Review of Systems  All other systems reviewed and are negative.  Blood pressure (!) 143/76, pulse (!) 58, temperature 97.9 F (36.6 C), resp. rate 17, height 5\' 11"  (1.803 m), weight 71.2 kg, SpO2 100%. Body mass index is 21.9 kg/m.   Social History   Tobacco Use  Smoking Status Every Day   Current packs/day: 1.00   Types: Cigarettes  Smokeless Tobacco Never   Tobacco Cessation:  A prescription for an  FDA-approved tobacco cessation medication provided at discharge   Blood Alcohol level:  Lab Results  Component Value Date   [facility] <10 05/07/2023   ETH <5 10/24/2015    Metabolic Disorder Labs:  Lab Results  Component Value Date   HGBA1C 5.2 05/11/2023   MPG 102.54 05/11/2023   No results found for: "PROLACTIN" Lab Results  Component Value Date   CHOL 134 05/11/2023   TRIG 82 05/11/2023   HDL 29 (L) 05/11/2023   CHOLHDL 4.6 05/11/2023   VLDL 16 05/11/2023   LDLCALC 89 05/11/2023    See Psychiatric Specialty Exam and Suicide Risk Assessment completed by Attending Physician prior to discharge.  Discharge destination:  [facility]  Is patient on multiple antipsychotic therapies at discharge:  No   Has Patient had three or more failed trials of antipsychotic monotherapy by history:  No  Recommended Plan for Multiple Antipsychotic Therapies: NA   Allergies as of 05/13/2023       Reactions   Tramadol Shortness Of Breath, Rash   Biaxin [clarithromycin] Hives        Medication List     TAKE these medications      Indication  buPROPion 75 MG tablet Commonly known as: WELLBUTRIN Take 1 tablet (75 mg total) by mouth daily. Start taking on: May 14, 2023  Indication: Depression   gabapentin 300 MG capsule Commonly known as: NEURONTIN Take 1 capsule (300 mg total) by mouth 2 (two) times daily.  Indication: Abuse or Misuse of Alcohol, Generalized Anxiety Disorder   naproxen 500 MG tablet Commonly known as: NAPROSYN Take 1 tablet (500 mg total) by mouth 2 (two) times daily with a meal.  Indication: Joint Damage causing Pain and Loss of Function   nicotine polacrilex 2 MG gum Commonly known as: NICORETTE Take 1 each (2 mg total) by mouth every 4 (four) hours while awake for 20 days.  Indication: Nicotine Addiction   QUEtiapine 50 MG tablet Commonly known as: SEROQUEL Take 1 tablet (50 mg total) by mouth at bedtime.  Indication: Depressive Phase of  Manic-Depression, Trouble Sleeping   traZODone 100 MG tablet Commonly known as: DESYREL Take 1 tablet (100 mg total) by mouth at bedtime as needed for sleep.  Indication: Trouble Sleeping        Follow-up Information     [facility] .   Why: Referral has been sent, you can follow up on the status of the application. Contact information: [address] [place] [address] [phone]         Services, [facility]. Go to.   Why: Appointment schedualed for 05/22/23 at 11AM. Contact information: [address] [place] [address] [phone]                 Follow-up recommendations:  Activity:  as tolerated Diet:  heart healthy  Comments:  Bupropion (Wellbutrin) 75 mg daily: For depression and withdrawal symptoms. Clonidine (Catapres) 0.1 mg QID: For withdrawal management and anxiety. Gabapentin (Neurontin) 300 mg QID: For anxiety and withdrawal symptoms. Trazodone (Desyrel) 100 mg QHS: For sleep. Hydroxyzine (Atarax) 25 mg Q6H PRN: For anxiety or agitation. Naproxen (Naprosyn) 500 mg BID PRN: For pain. Nicotine Gum 2 mg PRN: For smoking cessation. [facility]: Appointment on 05/22/23 at 11 AM Address: [address] [place], [place], [place] [address] Phone: [phone] [facility]: Referral submitted for further support. Address: [address] [place], [place] [address] Phone: [phone] National Suicide Prevention Lifeline: 988 (available 24/7). Mobile Crisis Team: 5097494681 (available 24/7). Emergency Services: Instruct the patient to call 911 or visit the nearest emergency room for immediate safety concerns. Discuss the risks of relapse and emphasize the need for continued outpatient care. Develop a safety plan, including identifying supportive individuals and resources to contact during a crisis. Recommend follow-up with a primary care provider to address medical issues, including poor appetite and sleep  disturbances.  Signed: Myriam Forehand, NP 05/13/2023, 5:38 PM

## 2023-05-20 ENCOUNTER — Emergency Department (HOSPITAL_COMMUNITY): Payer: Commercial Managed Care - HMO

## 2023-05-20 ENCOUNTER — Emergency Department (HOSPITAL_COMMUNITY)
Admission: EM | Admit: 2023-05-20 | Discharge: 2023-05-20 | Disposition: A | Discharge status: Home / Self Care | Payer: Commercial Managed Care - HMO | Attending: Emergency Medicine | Admitting: Emergency Medicine

## 2023-05-20 ENCOUNTER — Encounter (HOSPITAL_COMMUNITY): Payer: Self-pay | Admitting: *Deleted

## 2023-05-20 ENCOUNTER — Other Ambulatory Visit: Payer: Self-pay

## 2023-05-20 DIAGNOSIS — M25552 Pain in left hip: Secondary | ICD-10-CM | POA: Insufficient documentation

## 2023-05-20 DIAGNOSIS — S20212A Contusion of left front wall of thorax, initial encounter: Secondary | ICD-10-CM | POA: Diagnosis not present

## 2023-05-20 DIAGNOSIS — S0990XA Unspecified injury of head, initial encounter: Secondary | ICD-10-CM | POA: Diagnosis not present

## 2023-05-20 DIAGNOSIS — S3991XA Unspecified injury of abdomen, initial encounter: Secondary | ICD-10-CM | POA: Insufficient documentation

## 2023-05-20 DIAGNOSIS — W132XXA Fall from, out of or through roof, initial encounter: Secondary | ICD-10-CM | POA: Insufficient documentation

## 2023-05-20 DIAGNOSIS — W19XXXA Unspecified fall, initial encounter: Secondary | ICD-10-CM

## 2023-05-20 DIAGNOSIS — R799 Abnormal finding of blood chemistry, unspecified: Secondary | ICD-10-CM | POA: Insufficient documentation

## 2023-05-20 DIAGNOSIS — S299XXA Unspecified injury of thorax, initial encounter: Secondary | ICD-10-CM | POA: Diagnosis present

## 2023-05-20 LAB — BASIC METABOLIC PANEL
Anion gap: 7 (ref 5–15)
BUN: 16 mg/dL (ref 6–20)
CO2: 29 mmol/L (ref 22–32)
Calcium: 9.5 mg/dL (ref 8.9–10.3)
Chloride: 103 mmol/L (ref 98–111)
Creatinine, Ser: 0.8 mg/dL (ref 0.61–1.24)
GFR, Estimated: >60 mL/min (ref >60)
Glucose, Bld: 69 mg/dL — ABNORMAL LOW (ref 70–99)
Potassium: 4 mmol/L (ref 3.5–5.1)
Sodium: 139 mmol/L (ref 135–145)

## 2023-05-20 LAB — CBC WITH DIFFERENTIAL/PLATELET
Abs Immature Granulocytes: 0.02 10*3/uL (ref 0.00–0.07)
Basophils Absolute: 0.1 10*3/uL (ref 0.0–0.1)
Basophils Relative: 1 %
Eosinophils Absolute: 0.2 10*3/uL (ref 0.0–0.5)
Eosinophils Relative: 2 %
HCT: 41 % (ref 39.0–52.0)
Hemoglobin: 13.4 g/dL (ref 13.0–17.0)
Immature Granulocytes: 0 %
Lymphocytes Relative: 18 %
Lymphs Abs: 1.5 10*3/uL (ref 0.7–4.0)
MCH: 29.3 pg (ref 26.0–34.0)
MCHC: 32.7 g/dL (ref 30.0–36.0)
MCV: 89.7 fL (ref 80.0–100.0)
Monocytes Absolute: 0.7 10*3/uL (ref 0.1–1.0)
Monocytes Relative: 8 %
Neutro Abs: 6 10*3/uL (ref 1.7–7.7)
Neutrophils Relative %: 71 %
Platelets: 304 10*3/uL (ref 150–400)
RBC: 4.57 MIL/uL (ref 4.22–5.81)
RDW: 13.6 % (ref 11.5–15.5)
WBC: 8.4 10*3/uL (ref 4.0–10.5)
nRBC: 0 % (ref 0.0–0.2)

## 2023-05-20 LAB — URINALYSIS, ROUTINE W REFLEX MICROSCOPIC
Bilirubin Urine: NEGATIVE
Glucose, UA: NEGATIVE mg/dL
Hgb urine dipstick: NEGATIVE
Ketones, ur: NEGATIVE mg/dL
Leukocytes,Ua: NEGATIVE
Nitrite: NEGATIVE
Protein, ur: NEGATIVE mg/dL
Specific Gravity, Urine: 1.013 (ref 1.005–1.030)
pH: 6 (ref 5.0–8.0)

## 2023-05-20 LAB — CBG MONITORING, ED: Glucose-Capillary: 85 mg/dL (ref 70–99)

## 2023-05-20 MED ORDER — HYDROMORPHONE HCL 1 MG/ML IJ SOLN
1.0000 mg | Freq: Once | INTRAMUSCULAR | Status: AC
Start: 2023-05-20 — End: 2023-05-20
  Administered 2023-05-20: 1 mg via INTRAVENOUS
  Filled 2023-05-20: qty 1

## 2023-05-20 MED ORDER — HYDROMORPHONE HCL 1 MG/ML IJ SOLN
1.0000 mg | Freq: Once | INTRAMUSCULAR | Status: AC
  Administered 2023-05-20: 1 mg via INTRAVENOUS
  Filled 2023-05-20: qty 1

## 2023-05-20 MED ORDER — IBUPROFEN 800 MG PO TABS: 800.0000 mg | ORAL_TABLET | Freq: Three times a day (TID) | ORAL | 0 refills | Status: AC

## 2023-05-20 MED ORDER — ONDANSETRON HCL 4 MG/2ML IJ SOLN
4.0000 mg | Freq: Once | INTRAMUSCULAR | Status: AC
  Administered 2023-05-20: 4 mg via INTRAVENOUS
  Filled 2023-05-20: qty 2

## 2023-05-20 NOTE — ED Provider Notes (Signed)
Handoff from T. Triplett PA, here for fall 15 feet. Right sided chest pain. Scans unremarkable. Urinalysis pending. Plan is to DC.  Physical Exam  BP 120/81   Pulse (!) 59   Temp 98.2 F (36.8 C) (Oral)   Resp 10   Ht 5\' 11"  (1.803 m)   Wt 72.5 kg   SpO2 100%   BMI 22.29 kg/m   Physical Exam Vitals and nursing note reviewed.  Constitutional:      General: He is not in acute distress.    Appearance: He is well-developed.  HENT:     Head: Normocephalic and atraumatic.  Eyes:     Conjunctiva/sclera: Conjunctivae normal.  Cardiovascular:     Rate and Rhythm: Normal rate and regular rhythm.     Heart sounds: No murmur heard. Pulmonary:     Effort: Pulmonary effort is normal. No respiratory distress.     Breath sounds: Normal breath sounds.  Abdominal:     Palpations: Abdomen is soft.     Tenderness: There is no abdominal tenderness.  Musculoskeletal:        General: No swelling.     Cervical back: Neck supple.  Skin:    General: Skin is warm and dry.     Capillary Refill: Capillary refill takes less than 2 seconds.  Neurological:     Mental Status: He is alert.  Psychiatric:        Mood and Affect: Mood normal.     Procedures  Procedures  ED Course / MDM    Medical Decision Making Patient here for right chest wall pain, as well as hip pain, after falling about 15 feet.  He has no ecchymosis, wounds present.  His CT abdomen pelvis, as well as chest are unremarkable.  He was advised on follow-up with PCP, return precautions.  Was discharged home. Urinalysis unremarkable.  Likely represents chest wall contusion.  Amount and/or Complexity of Data Reviewed Labs: ordered. Radiology: ordered.  Risk Prescription drug management.          [name] [name], [place] 05/20/23 2025    Linwood Dibbles, MD 05/22/23 [phone]

## 2023-05-20 NOTE — ED Provider Notes (Signed)
[place] EMERGENCY DEPARTMENT AT [facility] Provider Note   CSN: [id number] Arrival date & time: 05/20/23  1605     History  Chief Complaint  Patient presents with   Marletta Lor    [name] [name] is a 47 y.o. male.   Fall Associated symptoms include chest pain (Left rib pain). Pertinent negatives include no abdominal pain, no headaches and no shortness of breath.       [name] [name] is a 47 y.o. male who presents to the Emergency Department complaining of left rib pain secondary to a fall that occurred just prior to arrival.  States he was standing on the roof of the house when he slipped and fell approximately 15 feet.  States he landed on his feet and fell to his left side.  He describes immediate pain of his ribs.  Has worsening pain with movement and deep breathing.  Denies feeling short of breath.  Also describes having some pain of his left hip but feels the area "sore" denies head injury or LOC.  Denies any significant neck pain.  Does not take blood thinners.  No headache or dizziness.  Denies any abdominal pain nausea or vomiting.  Home Medications Prior to Admission medications   Medication Sig Start Date End Date Taking? Authorizing Provider  buPROPion (WELLBUTRIN) 75 MG tablet Take 1 tablet (75 mg total) by mouth daily. 05/14/23 06/13/23  Myriam Forehand, NP  gabapentin (NEURONTIN) 300 MG capsule Take 1 capsule (300 mg total) by mouth 2 (two) times daily. 05/13/23 06/12/23  Myriam Forehand, NP  naproxen (NAPROSYN) 500 MG tablet Take 1 tablet (500 mg total) by mouth 2 (two) times daily with a meal. 05/13/23 06/12/23  Myriam Forehand, NP  nicotine polacrilex (NICORETTE) 2 MG gum Take 1 each (2 mg total) by mouth every 4 (four) hours while awake for 20 days. 05/13/23 06/02/23  Myriam Forehand, NP  QUEtiapine (SEROQUEL) 50 MG tablet Take 1 tablet (50 mg total) by mouth at bedtime. 05/13/23 06/12/23  Myriam Forehand, NP  traZODone (DESYREL) 100 MG tablet Take 1 tablet (100 mg  total) by mouth at bedtime as needed for sleep. 05/13/23 06/12/23  Myriam Forehand, NP      Allergies    Tramadol and Biaxin [clarithromycin]    Review of Systems   Review of Systems  Constitutional:  Negative for appetite change, diaphoresis and fever.  HENT:  Negative for sore throat.   Eyes:  Negative for visual disturbance.  Respiratory:  Negative for shortness of breath.   Cardiovascular:  Positive for chest pain (Left rib pain).  Gastrointestinal:  Negative for abdominal pain, diarrhea, nausea and vomiting.  Genitourinary:  Negative for dysuria and flank pain.  Musculoskeletal:  Positive for arthralgias (Left hip pain). Negative for back pain, neck pain and neck stiffness.  Skin:  Negative for wound.  Neurological:  Negative for dizziness, syncope, weakness, numbness and headaches.  Psychiatric/Behavioral:  Negative for confusion.     Physical Exam Updated Vital Signs BP (!) 138/91 (BP Location: Right Arm)   Pulse 79   Temp 98.2 F (36.8 C) (Oral)   Resp 16   Ht 5\' 11"  (1.803 m)   Wt 72.5 kg   SpO2 100%   BMI 22.29 kg/m  Physical Exam Vitals and nursing note reviewed.  Constitutional:      Comments: Patient grimacing, appears uncomfortable  HENT:     Head: Atraumatic.     Mouth/Throat:  Mouth: Mucous membranes are moist.     Pharynx: Oropharynx is clear.  Eyes:     Extraocular Movements: Extraocular movements intact.     Pupils: Pupils are equal, round, and reactive to light.  Cardiovascular:     Rate and Rhythm: Normal rate and regular rhythm.     Pulses: Normal pulses.  Pulmonary:     Effort: Pulmonary effort is normal. No respiratory distress.     Breath sounds: Normal breath sounds.  Chest:     Chest wall: Tenderness present. No crepitus.     Comments: Pt guarding left chest.  Tenderness to palpation of anterior left chest wall.  No abrasion or soft tissue crepitus on exam. Abdominal:     Palpations: Abdomen is soft.     Tenderness: There is no  abdominal tenderness. There is no guarding.  Musculoskeletal:        General: Tenderness and signs of injury present. No swelling.     Cervical back: Normal range of motion. No rigidity or tenderness.     Right lower leg: No edema.     Comments: Pain with range of motion of left hip.  No external rotation or shortening of the extremity.  Skin:    General: Skin is warm.     Capillary Refill: Capillary refill takes less than 2 seconds.  Neurological:     General: No focal deficit present.     Mental Status: He is alert.     Sensory: No sensory deficit.     Motor: No weakness.     ED Results / Procedures / Treatments   Labs (all labs ordered are listed, but only abnormal results are displayed) Labs Reviewed  BASIC METABOLIC PANEL - Abnormal; Notable for the following components:      Result Value   Glucose, Bld 69 (*)    All other components within normal limits  CBC WITH DIFFERENTIAL/PLATELET  URINALYSIS, ROUTINE W REFLEX MICROSCOPIC    EKG None  Radiology CT CHEST ABDOMEN PELVIS WO CONTRAST  Result Date: 05/20/2023 CLINICAL DATA:  Fall and poly trauma. EXAM: CT CHEST, ABDOMEN AND PELVIS WITHOUT CONTRAST TECHNIQUE: Multidetector CT imaging of the chest, abdomen and pelvis was performed following the standard protocol without IV contrast. RADIATION DOSE REDUCTION: This exam was performed according to the departmental dose-optimization program which includes automated exposure control, adjustment of the mA and/or kV according to patient size and/or use of iterative reconstruction technique. COMPARISON:  CT abdomen pelvis dated 11/01/2020. FINDINGS: Evaluation of this exam is limited in the absence of intravenous contrast as well as due to streak artifact caused by patient's arms. CT CHEST FINDINGS Cardiovascular: There is no cardiomegaly or pericardial effusion. Mild coronary vascular calcification of the LAD. The thoracic aorta and the central pulmonary arteries are grossly  unremarkable. Mediastinum/Nodes: No hilar or mediastinal adenopathy. The esophagus and the thyroid gland are grossly unremarkable. No mediastinal fluid collection. Lungs/Pleura: Several small subpleural nodules or para fissural lymph nodes measure up to 5 mm along the major fissure (74/5). No focal consolidation, pleural effusion, pneumothorax. The central airways are patent. Musculoskeletal: No acute osseous pathology. CT ABDOMEN PELVIS FINDINGS No intra-abdominal free air or free fluid. Hepatobiliary: The liver is unremarkable. No biliary ductal dilatation. The gallbladder is unremarkable. Pancreas: Unremarkable. No pancreatic ductal dilatation or surrounding inflammatory changes. Spleen: Normal in size without focal abnormality. Adrenals/Urinary Tract: The adrenal glands unremarkable there is no hydronephrosis or nephrolithiasis on either side. The visualized ureters and urinary bladder appear unremarkable. Stomach/Bowel: There  is no bowel obstruction or active inflammation. The appendix is normal. Vascular/Lymphatic: The abdominal aorta and IVC are grossly unremarkable on this noncontrast CT. No portal venous gas. There is no adenopathy. Reproductive: The prostate and seminal vesicles are grossly unremarkable. Other: None Musculoskeletal: No acute or significant osseous findings. IMPRESSION: 1. No acute/traumatic intrathoracic, abdominal, or pelvic pathology. 2. Several small subpleural nodules or para fissural lymph nodes measure up to 5 mm along the major fissure. No follow-up needed if patient is low-risk (and has no known or suspected primary neoplasm). Non-contrast chest CT can be considered in 12 months if patient is high-risk. This recommendation follows the consensus statement: Guidelines for Management of Incidental Pulmonary Nodules Detected on CT Images: From the Fleischner Society 2017; Radiology 2017; 284:228-243. 3. Mild coronary vascular calcification of the LAD. Electronically Signed   By: Elgie Collard M.D.   On: 05/20/2023 19:05   DG Hip Unilat W or Wo Pelvis 2-3 Views Left  Result Date: 05/20/2023 CLINICAL DATA:  Fall EXAM: DG HIP (WITH OR WITHOUT PELVIS) 2-3V LEFT COMPARISON:  CT 11/01/2020 FINDINGS: SI joints are non widened. Pubic symphysis and rami appear intact. Limited assessment of the femoral necks due to positioning and superimposition of the trochanters. No definitive fracture or malalignment is seen IMPRESSION: Limited assessment of the femoral necks due to positioning and superimposition of the trochanters. No definitive fracture or malalignment is seen. Electronically Signed   By: Jasmine Pang M.D.   On: 05/20/2023 18:34   CT L-SPINE NO CHARGE  Result Date: 05/20/2023 CLINICAL DATA:  Larey Seat 15 feet off a roof EXAM: CT THORACIC AND LUMBAR SPINE WITHOUT CONTRAST TECHNIQUE: Multidetector CT imaging of the thoracic and lumbar spine was performed without contrast. Multiplanar CT image reconstructions were also generated. RADIATION DOSE REDUCTION: This exam was performed according to the departmental dose-optimization program which includes automated exposure control, adjustment of the mA and/or kV according to patient size and/or use of iterative reconstruction technique. COMPARISON:  No prior CT of the thoracic or lumbar spine available, correlation is made with 05/31/2005 MRI of the thoracic spine FINDINGS: CT THORACIC SPINE FINDINGS Alignment: Exaggeration of the normal thoracic kyphosis. No listhesis. Vertebrae: No acute fracture or focal pathologic process. Paraspinal and other soft tissues: Please see same-day CT chest abdomen pelvis. Disc levels: No significant spinal canal stenosis. No visible canal hematoma. CT LUMBAR SPINE FINDINGS Segmentation: 5 lumbar type vertebral bodies. The last fully formed disc space is L5-S1. Alignment: No listhesis. Preservation of the normal lumbar lordosis. Vertebrae: No acute fracture or focal pathologic process. Mild deformity of the left L1  transverse process may be the sequela of remote trauma. Paraspinal and other soft tissues: Please see same-day CT chest abdomen pelvis. Disc levels: Disc heights are largely preserved. No high-grade spinal canal stenosis. No visible canal hematoma. IMPRESSION: No acute fracture or traumatic listhesis in the thoracic or lumbar spine. Electronically Signed   By: Wiliam Ke M.D.   On: 05/20/2023 18:07   CT T-SPINE NO CHARGE  Result Date: 05/20/2023 CLINICAL DATA:  Larey Seat 15 feet off a roof EXAM: CT THORACIC AND LUMBAR SPINE WITHOUT CONTRAST TECHNIQUE: Multidetector CT imaging of the thoracic and lumbar spine was performed without contrast. Multiplanar CT image reconstructions were also generated. RADIATION DOSE REDUCTION: This exam was performed according to the departmental dose-optimization program which includes automated exposure control, adjustment of the mA and/or kV according to patient size and/or use of iterative reconstruction technique. COMPARISON:  No prior CT of the  thoracic or lumbar spine available, correlation is made with 05/31/2005 MRI of the thoracic spine FINDINGS: CT THORACIC SPINE FINDINGS Alignment: Exaggeration of the normal thoracic kyphosis. No listhesis. Vertebrae: No acute fracture or focal pathologic process. Paraspinal and other soft tissues: Please see same-day CT chest abdomen pelvis. Disc levels: No significant spinal canal stenosis. No visible canal hematoma. CT LUMBAR SPINE FINDINGS Segmentation: 5 lumbar type vertebral bodies. The last fully formed disc space is L5-S1. Alignment: No listhesis. Preservation of the normal lumbar lordosis. Vertebrae: No acute fracture or focal pathologic process. Mild deformity of the left L1 transverse process may be the sequela of remote trauma. Paraspinal and other soft tissues: Please see same-day CT chest abdomen pelvis. Disc levels: Disc heights are largely preserved. No high-grade spinal canal stenosis. No visible canal hematoma. IMPRESSION:  No acute fracture or traumatic listhesis in the thoracic or lumbar spine. Electronically Signed   By: Wiliam Ke M.D.   On: 05/20/2023 18:07   CT Head Wo Contrast  Result Date: 05/20/2023 CLINICAL DATA:  Larey Seat 15 feet off a roof EXAM: CT HEAD WITHOUT CONTRAST CT CERVICAL SPINE WITHOUT CONTRAST TECHNIQUE: Multidetector CT imaging of the head and cervical spine was performed following the standard protocol without intravenous contrast. Multiplanar CT image reconstructions of the cervical spine were also generated. RADIATION DOSE REDUCTION: This exam was performed according to the departmental dose-optimization program which includes automated exposure control, adjustment of the mA and/or kV according to patient size and/or use of iterative reconstruction technique. COMPARISON:  None Available. FINDINGS: CT HEAD FINDINGS Brain: No evidence of acute infarct, hemorrhage, mass, mass effect, or midline shift. No hydrocephalus or extra-axial fluid collection. Vascular: No hyperdense vessel. Skull: Negative for fracture or focal lesion. Sinuses/Orbits: Mucous retention cyst in the right maxillary sinus. No acute finding in the orbits. Other: The mastoid air cells are well aerated. CT CERVICAL SPINE FINDINGS Alignment: No traumatic listhesis. Skull base and vertebrae: No acute fracture or suspicious osseous lesion. Remote fracture of the spinous process of C7. Soft tissues and spinal canal: No prevertebral fluid or swelling. No visible canal hematoma. Disc levels: Mild degenerative changes in the cervical spine.No high-grade spinal canal stenosis. Upper chest: For findings in the thorax, please see same day CT chest. IMPRESSION: 1. No acute intracranial process. 2. No acute fracture or traumatic listhesis in the cervical spine. Electronically Signed   By: Wiliam Ke M.D.   On: 05/20/2023 18:01   CT Cervical Spine Wo Contrast  Result Date: 05/20/2023 CLINICAL DATA:  Larey Seat 15 feet off a roof EXAM: CT HEAD WITHOUT  CONTRAST CT CERVICAL SPINE WITHOUT CONTRAST TECHNIQUE: Multidetector CT imaging of the head and cervical spine was performed following the standard protocol without intravenous contrast. Multiplanar CT image reconstructions of the cervical spine were also generated. RADIATION DOSE REDUCTION: This exam was performed according to the departmental dose-optimization program which includes automated exposure control, adjustment of the mA and/or kV according to patient size and/or use of iterative reconstruction technique. COMPARISON:  None Available. FINDINGS: CT HEAD FINDINGS Brain: No evidence of acute infarct, hemorrhage, mass, mass effect, or midline shift. No hydrocephalus or extra-axial fluid collection. Vascular: No hyperdense vessel. Skull: Negative for fracture or focal lesion. Sinuses/Orbits: Mucous retention cyst in the right maxillary sinus. No acute finding in the orbits. Other: The mastoid air cells are well aerated. CT CERVICAL SPINE FINDINGS Alignment: No traumatic listhesis. Skull base and vertebrae: No acute fracture or suspicious osseous lesion. Remote fracture of the spinous process of  C7. Soft tissues and spinal canal: No prevertebral fluid or swelling. No visible canal hematoma. Disc levels: Mild degenerative changes in the cervical spine.No high-grade spinal canal stenosis. Upper chest: For findings in the thorax, please see same day CT chest. IMPRESSION: 1. No acute intracranial process. 2. No acute fracture or traumatic listhesis in the cervical spine. Electronically Signed   By: Wiliam Ke M.D.   On: 05/20/2023 18:01    Procedures Procedures    Medications Ordered in ED Medications  HYDROmorphone (DILAUDID) injection 1 mg (has no administration in time range)  ondansetron (ZOFRAN) injection 4 mg (has no administration in time range)    ED Course/ Medical Decision Making/ A&P                                 Medical Decision Making Patient here for evaluation of injury  sustained after an approximately 15 foot fall from a roof.  Describes landing on both feet then falling to his left side.  Having pain along his left chest wall and left hip.  Denies head injury or LOC.  No shortness of breath but having pain with movement and breathing.  Uncomfortable appearing guarding his left chest wall on my exam.  No hypoxia.  Patient does not take blood thinners.  No abdominal pain on my exam  Clinically, high suspicion for rib fracture, likely multiple.  Lung sounds clear on my exam.  Pneumothorax also considered.  Distracting injuries also considered.  Amount and/or Complexity of Data Reviewed Labs: ordered.    Details: Labs interpreted by me no evidence of leukocytosis, chemistries without derangement.  Urinalysis is pending Radiology: ordered.    Details: Plain film of the left hip without acute fracture or dislocation. CT head and C-spine without acute intracranial process.  No acute fracture or traumatic listhesis of the cervical spine No charge CT lumbar and thoracic spine without for acute fracture or traumatic listhesis of the thoracic or lumbar spine  CT chest abdomen and pelvis results without evidence of acute/traumatic intrathoracic abdominal or pelvic findings Discussion of management or test interpretation with external provider(s): On recheck, patient continues to have pain of the left chest wall.  He has been given IV pain medication here.  No hypoxia or tachycardia.   Workup today without acute fracture or evidence of pneumothorax.  Pain has been addressed here.  No indication for prescription narcotics.  Will prescribe NSAID for pain control.  Incentive spirometer dispensed for home use.  UA results still pending.  Patient will likely be discharged home, discussed with Barnie Alderman, PA-C who will review urinalysis results and arrange appropriate dispo  Risk Prescription drug management.           Final Clinical Impression(s) / ED  Diagnoses Final diagnoses:  Fall, initial encounter  Contusion of rib on left side, initial encounter    Rx / DC Orders ED Discharge Orders     None         Rosey Bath 05/20/23 Marius Ditch, MD 05/22/23 [phone]

## 2023-05-20 NOTE — ED Triage Notes (Signed)
Pt states he fell approximately 15 feet off a roof and landed on his feet but fell to the left; pt having severe left side rib pain and pt complains the pain is worse with breathing

## 2023-05-20 NOTE — Discharge Instructions (Addendum)
Your workup today was reassuring.  No evidence of a punctured lung or broken ribs.  No evidence of spinal fractures or dislocations.  Your chest will likely be sore for several days.  I recommend that you use the incentive spirometer several times a day to help keep your lungs clear.  You may use a pillow or folded towel held to your chest to cough sneeze or when using a spirometer.  You have been prescribed ibuprofen to help with your pain.  Please follow-up with your primary care provider for recheck.  [facility] Primary Care Doctor List    Syliva Overman, MD. Specialty: [facility] Medicine Contact information: [address]  [place] [address]  [phone]   Lilyan Punt, MD. Specialty: [facility] Medicine Contact information: [name]  [place] [address]  [phone]   Avon Gully, MD Specialty: Internal Medicine Contact information: [address] [place] [address]  [phone]   Catalina Pizza, MD. Specialty: Internal Medicine Contact information: [address]  [place] [address]  [phone]    [facility] (Dr. Selena Batten) Specialty: Family Medicine Contact information: [address]  [place] [address]  [phone]   John Giovanni, MD. Specialty: [facility] Medicine Contact information: [address]  [address]  [place] [address]  [phone]   Carylon Perches, MD. Specialty: Internal Medicine Contact information: [address]  [address]  [place] [address]  [phone]   [facility] Family Medicine: [address] [phone]  [facility], Family medicine [address]  [phone]  [facility] [address] [place], [place] [phone]  [facility]: [address] [phone]    [facility]  [address] [place], [place] [address] [phone]  Services [facility] offers a  variety of basic health services.  Services include but are not limited to: Blood pressure checks  Heart rate checks  Blood sugar checks  Urine analysis  Rapid strep tests  Pregnancy tests.  Health education and referrals  People needing more complex services will be directed to a physician online. Using these virtual visits, doctors can evaluate and prescribe medicine and treatments. There will be no medication on-site, though [facility] will help patients fill their prescriptions at little to no cost.   For More information please go to: [email or web address]  Allergy and Asthma:    [address] [place] Ace [phone]  Urology:  [address]  [place] [phone]  [facility]  [address] [place], [place] [phone]  Orthopedics   [address] [place], [place] [phone]  Endocrinology  [address] [place], [place] [phone]  Podiatry: [facility] [phone]
